# Patient Record
Sex: Female | Born: 1995 | Race: White | Hispanic: No | Marital: Married | Smoking: Former smoker
Health system: Southern US, Community
[De-identification: ages and names within clinical notes are randomized; demographics above are authoritative.]

## PROBLEM LIST (undated history)

## (undated) ENCOUNTER — Inpatient Hospital Stay (HOSPITAL_COMMUNITY): Payer: Self-pay

## (undated) DIAGNOSIS — R519 Headache, unspecified: Secondary | ICD-10-CM

## (undated) DIAGNOSIS — F909 Attention-deficit hyperactivity disorder, unspecified type: Secondary | ICD-10-CM

## (undated) DIAGNOSIS — J45909 Unspecified asthma, uncomplicated: Secondary | ICD-10-CM

## (undated) DIAGNOSIS — R51 Headache: Secondary | ICD-10-CM

## (undated) HISTORY — PX: TONSILLECTOMY: SUR1361

## (undated) HISTORY — PX: ADENOIDECTOMY: SUR15

---
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
## 2014-03-10 ENCOUNTER — Encounter (HOSPITAL_COMMUNITY): Payer: Self-pay | Admitting: Anesthesiology

## 2014-11-19 ENCOUNTER — Inpatient Hospital Stay (HOSPITAL_COMMUNITY)
Admission: AD | Admit: 2014-11-19 | Discharge: 2014-11-19 | Disposition: A | Discharge status: Home / Self Care | Payer: Medicaid Other | Source: Ambulatory Visit | Attending: Obstetrics and Gynecology | Admitting: Obstetrics and Gynecology

## 2014-11-19 DIAGNOSIS — Z3200 Encounter for pregnancy test, result unknown: Secondary | ICD-10-CM | POA: Diagnosis present

## 2014-11-19 DIAGNOSIS — Z3202 Encounter for pregnancy test, result negative: Secondary | ICD-10-CM | POA: Diagnosis not present

## 2014-11-19 LAB — URINALYSIS, ROUTINE W REFLEX MICROSCOPIC
Bilirubin Urine: NEGATIVE
Glucose, UA: NEGATIVE mg/dL
Ketones, ur: NEGATIVE mg/dL
Leukocytes, UA: NEGATIVE
NITRITE: NEGATIVE
PH: 7 (ref 5.0–8.0)
PROTEIN: NEGATIVE mg/dL
Specific Gravity, Urine: 1.015 (ref 1.005–1.030)
Urobilinogen, UA: 0.2 mg/dL (ref 0.0–1.0)

## 2014-11-19 LAB — URINE MICROSCOPIC-ADD ON

## 2014-11-19 LAB — POCT PREGNANCY, URINE: Preg Test, Ur: NEGATIVE

## 2014-11-19 NOTE — MAU Note (Signed)
Patient presents for possible pregnancy and states she has Nexplanon implant. Denies bleeding but states she has noticed a discharge.

## 2014-11-19 NOTE — MAU Provider Note (Signed)
Danielle ArdsJenny N Morris 19 y.o.. G1P1001 presents to MAU for pregnancy test only.  She denies complaint but is worried.  She has Nexplanon in place.  It was placed 1.5 years ago.  She has a friend that had Nexplanon, had negative pregnancy tests and then found out late of pregnancy.  She has NOT had any positive home pregnancy tests.    ROS: Pertinent ROS in HPI.  All other systems are negative.    Objective:   BP 137/73 mmHg  Pulse 93  Temp(Src) 98.2 F (36.8 C) (Oral)  Resp 16  Ht 5' 1.75" (1.568 m)  Wt 129 lb 6 oz (58.684 kg)  BMI 23.87 kg/m2  LMP 10/12/2014  General appearance: well developed, well nourished.   Mental status: alert and oriented.    Results for orders placed or performed during the hospital encounter of 11/19/14 (from the past 48 hour(s))  Pregnancy, urine POC     Status: None   Collection Time: 11/19/14  4:55 PM  Result Value Ref Range   Preg Test, Ur NEGATIVE NEGATIVE    Comment:        THE SENSITIVITY OF THIS METHODOLOGY IS >24 mIU/mL     MDM: Due to no complaint, there is no reason for further workup.    A: Negative Pregnancy Test  P: Discharge to home. Pt advised to see her gynecologist, Dr. Ambrose MantleHenley, for further discussion and workup as needed.   Patient may return to MAU as needed or if her condition were to change or worsen

## 2015-04-07 ENCOUNTER — Emergency Department (HOSPITAL_COMMUNITY)
Admission: EM | Admit: 2015-04-07 | Discharge: 2015-04-07 | Disposition: A | Discharge status: Home / Self Care | Payer: Medicaid Other | Attending: Emergency Medicine | Admitting: Emergency Medicine

## 2015-04-07 ENCOUNTER — Emergency Department (HOSPITAL_COMMUNITY): Payer: Medicaid Other

## 2015-04-07 ENCOUNTER — Encounter (HOSPITAL_COMMUNITY): Payer: Self-pay | Admitting: Emergency Medicine

## 2015-04-07 DIAGNOSIS — Z79899 Other long term (current) drug therapy: Secondary | ICD-10-CM | POA: Insufficient documentation

## 2015-04-07 DIAGNOSIS — Z8659 Personal history of other mental and behavioral disorders: Secondary | ICD-10-CM | POA: Diagnosis not present

## 2015-04-07 DIAGNOSIS — J45901 Unspecified asthma with (acute) exacerbation: Secondary | ICD-10-CM | POA: Diagnosis not present

## 2015-04-07 DIAGNOSIS — J40 Bronchitis, not specified as acute or chronic: Secondary | ICD-10-CM

## 2015-04-07 DIAGNOSIS — R05 Cough: Secondary | ICD-10-CM | POA: Diagnosis present

## 2015-04-07 MED ORDER — PREDNISONE 20 MG PO TABS: 60.0000 mg | ORAL_TABLET | Freq: Every day | ORAL | Status: DC

## 2015-04-07 MED ORDER — ALBUTEROL SULFATE HFA 108 (90 BASE) MCG/ACT IN AERS
2.0000 | INHALATION_SPRAY | RESPIRATORY_TRACT | Status: DC | PRN
  Administered 2015-04-07: 2 via RESPIRATORY_TRACT
  Filled 2015-04-07: qty 6.7

## 2015-04-07 MED ORDER — ALBUTEROL SULFATE HFA 108 (90 BASE) MCG/ACT IN AERS: 2.0000 | INHALATION_SPRAY | RESPIRATORY_TRACT | Status: DC | PRN

## 2015-04-07 NOTE — ED Notes (Signed)
Pt. Left with all belongings and refused wheelchair. Discharge instructions were reviewed and all questions were answered.  

## 2015-04-07 NOTE — Discharge Instructions (Signed)

## 2015-04-07 NOTE — ED Provider Notes (Signed)
CSN: [id number]     Arrival date & time 04/07/15  [id number] History   First MD Initiated Contact with Patient 04/07/15 0449     Chief Complaint  Patient presents with  . Cough     (Consider location/radiation/quality/duration/timing/severity/associated sxs/prior Treatment) HPI  This is a [age] year old female with history of asthma who presents with cough. Patient reports cough, chest congestion, nasal congestion 2 weeks. Acutely worsening of the last several days. History of asthma but is not using an inhaler and reports that it is "not bad." Patient is also a smoker. Cough is nonproductive. Denies fevers at home. Patient has been taking over-the-counter medications without relief.  Past Medical History  Diagnosis Date  . Asthma   . ADHD (attention deficit hyperactivity disorder)    Past Surgical History  Procedure Laterality Date  . Tonsillectomy    . Adenoidectomy     No family history on file. Social History  Substance Use Topics  . Smoking status: Never Smoker   . Smokeless tobacco: Never Used  . Alcohol Use: No   OB History    Gravida Para Term Preterm AB TAB SAB Ectopic Multiple Living   1 1 1       1      Review of Systems  Constitutional: Negative for fever.  HENT: Positive for congestion.   Respiratory: Positive for cough and shortness of breath. Negative for chest tightness.   Cardiovascular: Negative for chest pain and leg swelling.  Gastrointestinal: Negative for abdominal pain.  All other systems reviewed and are negative.     Allergies  Review of patient's allergies indicates no known allergies.  Home Medications   Prior to Admission medications   Medication Sig Start Date End Date Taking? Authorizing Provider  albuterol (PROVENTIL HFA;VENTOLIN HFA) 108 (90 BASE) MCG/ACT inhaler Inhale 2 puffs into the lungs every 4 (four) hours as needed for wheezing or shortness of breath. 04/07/15   Shon Batonourtney F Horton, MD  ibuprofen (ADVIL,MOTRIN) 100 MG/5ML suspension  Take 30 mLs (600 mg total) by mouth every 6 (six) hours as needed. 07/13/13   Tracey Harrieshomas Henley, MD  predniSONE (DELTASONE) 20 MG tablet Take 3 tablets (60 mg total) by mouth daily with breakfast. 04/07/15   Shon Batonourtney F Horton, MD  Prenatal Vit-Fe Fumarate-FA (PRENATAL MULTIVITAMIN) TABS tablet Take 1 tablet by mouth daily at 12 noon.    Historical Provider, MD   BP 107/66 mmHg  Pulse 88  Temp(Src) 97.9 F (36.6 C) (Oral)  Resp 16  Ht 5\' 2"  (1.575 m)  Wt 128 lb (58.06 kg)  BMI 23.41 kg/m2  SpO2 97%  LMP 03/17/2015 (Approximate) Physical Exam  Constitutional: She is oriented to person, place, and time. She appears well-developed and well-nourished. No distress.  HENT:  Head: Normocephalic and atraumatic.  Neck: Neck supple.  Cardiovascular: Normal rate, regular rhythm and normal heart sounds.   No murmur heard. Pulmonary/Chest: Effort normal and breath sounds normal. No respiratory distress. She has no wheezes.  Scant expiratory wheeze  Abdominal: Soft. There is no tenderness.  Musculoskeletal: She exhibits no edema.  Neurological: She is alert and oriented to person, place, and time.  Skin: Skin is warm and dry.  Psychiatric: She has a normal mood and affect.  Nursing note and vitals reviewed.   ED Course  Procedures (including critical care time) Labs Review Labs Reviewed - No data to display  Imaging Review Dg Chest 2 View  04/07/2015  CLINICAL DATA:  Cough for 3 weeks.  Dyspnea tonight. EXAM: CHEST  2 VIEW COMPARISON:  06/01/2010 FINDINGS: The heart size and mediastinal contours are within normal limits. Both lungs are clear. The visualized skeletal structures are unremarkable. IMPRESSION: No active cardiopulmonary disease. Electronically Signed   By: Ellery Plunk M.D.   On: 04/07/2015 05:18   I have personally reviewed and evaluated these images and lab results as part of my medical decision-making.   EKG Interpretation None      MDM   Final diagnoses:  Bronchitis     Patient presents with cough and congestion 2 weeks. Nontoxic on exam. Afebrile. Vital signs are reassuring. Physical exam is largely unremarkable. No significant wheezing. However, given patient's history of asthma and smoking, will order an inhaler to see if that helps with the cough. Chest x-ray is negative for pneumonia. Suspect viral URI/bronchitis.  Discuss with patient supportive care home. Will place on 5 days of steroids for airway inflammation.  After history, exam, and medical workup I feel the patient has been appropriately medically screened and is safe for discharge home. Pertinent diagnoses were discussed with the patient. Patient was given return precautions.     Shon Baton, MD 04/07/15 [phone]

## 2015-04-07 NOTE — ED Notes (Signed)
Pt. reports persistent productive cough with chest congestion / SOB and nasal congestion onset 2 weeks ago unrelieved by OTC cough medications .

## 2015-05-10 NOTE — L&D Delivery Note (Signed)
Delivery Note At 4:49 PM a viable and healthy female was delivered via Vaginal, Spontaneous Delivery (Presentation: OA; ROT).  APGAR: 8, 9; weight P  .   Placenta status: delivered, intact .  Cord: 3V with the following complications: none.     Pt with mild shoulder dystocia, relieved with McRoberts and posterior shoulder dislodgement.  apx 20 sec.  Anesthesia:  epidural Episiotomy: None Lacerations: Labial R Suture Repair: 3.0 vicryl rapide Est. Blood Loss (mL): 250cc  Mom to postpartum.  Baby to Couplet care / Skin to Skin.  Bovard-Stuckert, Charley Lafrance 03/17/2016, 5:33 PM  Br/O+/RI/Tdap in PNC/Contra IUD  D/w pt circumcision for female infant, inc r/b/a - will have circ in office

## 2015-11-13 ENCOUNTER — Encounter (HOSPITAL_COMMUNITY): Payer: Self-pay | Admitting: *Deleted

## 2015-11-13 ENCOUNTER — Inpatient Hospital Stay (HOSPITAL_COMMUNITY)
Admission: AD | Admit: 2015-11-13 | Discharge: 2015-11-13 | Disposition: A | Discharge status: Home / Self Care | Payer: Medicaid Other | Source: Ambulatory Visit | Attending: Obstetrics and Gynecology | Admitting: Obstetrics and Gynecology

## 2015-11-13 DIAGNOSIS — Z3A20 20 weeks gestation of pregnancy: Secondary | ICD-10-CM | POA: Diagnosis not present

## 2015-11-13 DIAGNOSIS — J45909 Unspecified asthma, uncomplicated: Secondary | ICD-10-CM | POA: Insufficient documentation

## 2015-11-13 DIAGNOSIS — F909 Attention-deficit hyperactivity disorder, unspecified type: Secondary | ICD-10-CM | POA: Insufficient documentation

## 2015-11-13 DIAGNOSIS — O99342 Other mental disorders complicating pregnancy, second trimester: Secondary | ICD-10-CM | POA: Insufficient documentation

## 2015-11-13 DIAGNOSIS — O4702 False labor before 37 completed weeks of gestation, second trimester: Secondary | ICD-10-CM | POA: Diagnosis not present

## 2015-11-13 DIAGNOSIS — O99512 Diseases of the respiratory system complicating pregnancy, second trimester: Secondary | ICD-10-CM | POA: Insufficient documentation

## 2015-11-13 LAB — URINALYSIS, ROUTINE W REFLEX MICROSCOPIC
Bilirubin Urine: NEGATIVE
Glucose, UA: NEGATIVE mg/dL
Hgb urine dipstick: NEGATIVE
Ketones, ur: NEGATIVE mg/dL
LEUKOCYTES UA: NEGATIVE
Nitrite: NEGATIVE
PROTEIN: NEGATIVE mg/dL
SPECIFIC GRAVITY, URINE: 1.01 (ref 1.005–1.030)
pH: 7 (ref 5.0–8.0)

## 2015-11-13 NOTE — Progress Notes (Signed)
Toco only applied since 4515w6d

## 2015-11-13 NOTE — MAU Provider Note (Signed)
History     CSN: [id number]  Arrival date and time: 11/13/15 2015   First Provider Initiated Contact with Patient 11/13/15 2050      Chief Complaint  Patient presents with  . abdominal tightening    HPI Danielle Goodman is a 20 y.o. G2P1001 at 2510w6d who presents with abdominal tightening. Symptoms began at 3 pm this afternoon & occurs every 5-10 minutes. Reports abdominal tightening. Denies abdominal pain, vaginal bleeding or LOF. Last intercourse 3 days ago. Pt states she thinks she overworked herself today by running errands, cleaning the house, & not drinking enough water.   OB History    Gravida Para Term Preterm AB TAB SAB Ectopic Multiple Living   2 1 1       1       Past Medical History  Diagnosis Date  . Asthma   . ADHD (attention deficit hyperactivity disorder)     Past Surgical History  Procedure Laterality Date  . Tonsillectomy    . Adenoidectomy      History reviewed. No pertinent family history.  Social History  Substance Use Topics  . Smoking status: Never Smoker   . Smokeless tobacco: Never Used  . Alcohol Use: No    Allergies: No Known Allergies  Prescriptions prior to admission  Medication Sig Dispense Refill Last Dose  . acetaminophen (TYLENOL) 325 MG tablet Take 650 mg by mouth every 6 (six) hours as needed for headache.    Past Week at Unknown time  . calcium carbonate (TUMS - DOSED IN MG ELEMENTAL CALCIUM) 500 MG chewable tablet Chew 2 tablets by mouth daily.   11/12/2015 at Unknown time  . Prenatal Vit-Fe Fumarate-FA (PRENATAL MULTIVITAMIN) TABS tablet Take 1 tablet by mouth daily at 12 noon.   11/13/2015 at Unknown time  . albuterol (PROVENTIL HFA;VENTOLIN HFA) 108 (90 BASE) MCG/ACT inhaler Inhale 2 puffs into the lungs every 4 (four) hours as needed for wheezing or shortness of breath. (Patient not taking: Reported on 11/13/2015) 1 Inhaler 0   . ibuprofen (ADVIL,MOTRIN) 100 MG/5ML suspension Take 30 mLs (600 mg total) by mouth every 6 (six) hours as  needed. (Patient not taking: Reported on 11/13/2015) 473 mL 2   . predniSONE (DELTASONE) 20 MG tablet Take 3 tablets (60 mg total) by mouth daily with breakfast. (Patient not taking: Reported on 11/13/2015) 15 tablet 0     Review of Systems  Constitutional: Negative.   Gastrointestinal: Negative.   Genitourinary: Negative.    Physical Exam   Blood pressure 132/70, pulse 80, temperature 98.2 F (36.8 C), resp. rate 18, height 5\' 1"  (1.549 m), weight 144 lb 9.6 oz (65.59 kg), last menstrual period 03/17/2015, unknown if currently breastfeeding.  Physical Exam  Nursing note and vitals reviewed. Constitutional: She is oriented to person, place, and time. She appears well-developed and well-nourished. No distress.  HENT:  Head: Normocephalic and atraumatic.  Eyes: Conjunctivae are normal. Right eye exhibits no discharge. Left eye exhibits no discharge. No scleral icterus.  Neck: Normal range of motion.  Cardiovascular: Normal rate.   Respiratory: Effort normal. No respiratory distress.  GI: Soft. There is no tenderness.  Neurological: She is alert and oriented to person, place, and time.  Skin: Skin is warm and dry. She is not diaphoretic.  Psychiatric: She has a normal mood and affect. Her behavior is normal. Judgment and thought content normal.   Dilation: Closed Effacement (%): Thick Cervical Position: Posterior Station: -3 Exam by:: Judeth HornErin Armonte Tortorella NP  MAU Course  Procedures Results for orders placed or performed during the hospital encounter of 11/13/15 (from the past 24 hour(s))  Urinalysis, Routine w reflex microscopic ([facility])     Status: None   Collection Time: 11/13/15  8:40 PM  Result Value Ref Range   Color, Urine YELLOW YELLOW   APPearance CLEAR CLEAR   Specific Gravity, Urine 1.010 1.005 - 1.030   pH 7.0 5.0 - 8.0   Glucose, UA NEGATIVE NEGATIVE mg/dL   Hgb urine dipstick NEGATIVE NEGATIVE   Bilirubin Urine NEGATIVE NEGATIVE   Ketones, ur NEGATIVE NEGATIVE mg/dL    Protein, ur NEGATIVE NEGATIVE mg/dL   Nitrite NEGATIVE NEGATIVE   Leukocytes, UA NEGATIVE NEGATIVE    MDM FHT 145 by doppler No ctx on toco Abdomen palpates soft PO hydrate Cervix closed S.w Dr. Senaida Oresichardson. Encourage pt to stay hydrated. Ok to discharge home.  Assessment and Plan  A: 1. Preterm contractions, second trimester     P; Discharge home Preterm labor precautions Increased water intake during the day Keep f/u with OB  Judeth HornErin Blade Scheff 11/13/2015, 8:50 PM

## 2015-11-13 NOTE — Discharge Instructions (Signed)

## 2015-11-13 NOTE — MAU Note (Signed)
Having tightening in stomach since 1500. No pain. Denies vag bleeding or LOF. With first baby had ctxs at 25-26wks but del at 38wks

## 2015-12-03 ENCOUNTER — Inpatient Hospital Stay (HOSPITAL_COMMUNITY)
Admission: AD | Admit: 2015-12-03 | Discharge: 2015-12-03 | Disposition: A | Discharge status: Home / Self Care | Payer: Medicaid Other | Source: Ambulatory Visit | Attending: Obstetrics and Gynecology | Admitting: Obstetrics and Gynecology

## 2015-12-03 ENCOUNTER — Encounter (HOSPITAL_COMMUNITY): Payer: Self-pay | Admitting: Obstetrics and Gynecology

## 2015-12-03 DIAGNOSIS — Z87891 Personal history of nicotine dependence: Secondary | ICD-10-CM | POA: Insufficient documentation

## 2015-12-03 DIAGNOSIS — F909 Attention-deficit hyperactivity disorder, unspecified type: Secondary | ICD-10-CM | POA: Diagnosis not present

## 2015-12-03 DIAGNOSIS — G43909 Migraine, unspecified, not intractable, without status migrainosus: Secondary | ICD-10-CM | POA: Diagnosis present

## 2015-12-03 DIAGNOSIS — O99512 Diseases of the respiratory system complicating pregnancy, second trimester: Secondary | ICD-10-CM | POA: Diagnosis not present

## 2015-12-03 DIAGNOSIS — O26892 Other specified pregnancy related conditions, second trimester: Secondary | ICD-10-CM | POA: Diagnosis not present

## 2015-12-03 DIAGNOSIS — Z3A23 23 weeks gestation of pregnancy: Secondary | ICD-10-CM | POA: Diagnosis not present

## 2015-12-03 DIAGNOSIS — J45909 Unspecified asthma, uncomplicated: Secondary | ICD-10-CM | POA: Diagnosis not present

## 2015-12-03 DIAGNOSIS — Z79899 Other long term (current) drug therapy: Secondary | ICD-10-CM | POA: Insufficient documentation

## 2015-12-03 DIAGNOSIS — O99342 Other mental disorders complicating pregnancy, second trimester: Secondary | ICD-10-CM | POA: Insufficient documentation

## 2015-12-03 DIAGNOSIS — O99352 Diseases of the nervous system complicating pregnancy, second trimester: Secondary | ICD-10-CM | POA: Diagnosis not present

## 2015-12-03 DIAGNOSIS — R51 Headache: Secondary | ICD-10-CM

## 2015-12-03 LAB — URINE MICROSCOPIC-ADD ON

## 2015-12-03 LAB — URINALYSIS, ROUTINE W REFLEX MICROSCOPIC
Bilirubin Urine: NEGATIVE
Glucose, UA: NEGATIVE mg/dL
Ketones, ur: NEGATIVE mg/dL
LEUKOCYTES UA: NEGATIVE
Nitrite: NEGATIVE
PROTEIN: NEGATIVE mg/dL
pH: 6.5 (ref 5.0–8.0)

## 2015-12-03 MED ORDER — BUTALBITAL-APAP-CAFFEINE 50-325-40 MG PO TABS: 1.0000 | ORAL_TABLET | ORAL | 0 refills | Status: DC | PRN

## 2015-12-03 NOTE — MAU Note (Signed)
Urine in lab 

## 2015-12-03 NOTE — Discharge Instructions (Signed)

## 2015-12-03 NOTE — MAU Provider Note (Signed)
History     CSN: [id number]  Arrival date and time: 12/03/15 1608   First Provider Initiated Contact with Patient 12/03/15 1832      Chief Complaint  Patient presents with  . Headache  . visual changes   Danielle Goodman is a 20 y.o. G2P1001 at [redacted]w[redacted]d who presents with headache & vision changes. Denies OB complaints. Positive fetal movement. Pt reports these symptoms occur weekly since [redacted] week gestation and occurred with her previous pregnancy as well.   Headache   This is a recurrent problem. The current episode started today. Episode frequency: weekly during pregnancy. The problem has been unchanged. The pain is located in the frontal region. The pain does not radiate. The pain quality is similar to prior headaches. The quality of the pain is described as dull and aching. The pain is at a severity of 8/10 (currently no pain; headache resolved PTA without meds). Associated symptoms include a visual change. Pertinent negatives include no abdominal pain, abnormal behavior, blurred vision, coughing, dizziness, fever, loss of balance, nausea, neck pain, phonophobia, photophobia or vomiting. Nothing aggravates the symptoms. She has tried acetaminophen for the symptoms. The treatment provided no relief. Her past medical history is significant for migraine headaches (during pregnancy). There is no history of hypertension, pseudotumor cerebri or recent head traumas.     OB History    Gravida Para Term Preterm AB Living   2 1 1     1    SAB TAB Ectopic Multiple Live Births                  Past Medical History:  Diagnosis Date  . ADHD (attention deficit hyperactivity disorder)   . Asthma     Past Surgical History:  Procedure Laterality Date  . ADENOIDECTOMY    . TONSILLECTOMY      History reviewed. No pertinent family history.  Social History  Substance Use Topics  . Smoking status: Former Games developer  . Smokeless tobacco: Never Used  . Alcohol use No    Allergies: No Known  Allergies  Prescriptions Prior to Admission  Medication Sig Dispense Refill Last Dose  . acetaminophen (TYLENOL) 325 MG tablet Take 650 mg by mouth every 6 (six) hours as needed for headache.    Past Month at Unknown time  . calcium carbonate (TUMS - DOSED IN MG ELEMENTAL CALCIUM) 500 MG chewable tablet Chew 2 tablets by mouth daily.   Past Month at Unknown time  . Prenatal Vit-Fe Fumarate-FA (PRENATAL MULTIVITAMIN) TABS tablet Take 1 tablet by mouth daily at 12 noon.   12/03/2015 at Unknown time  . albuterol (PROVENTIL HFA;VENTOLIN HFA) 108 (90 BASE) MCG/ACT inhaler Inhale 2 puffs into the lungs every 4 (four) hours as needed for wheezing or shortness of breath. (Patient not taking: Reported on 11/13/2015) 1 Inhaler 0     Review of Systems  Constitutional: Negative for fever.  Eyes: Negative for blurred vision and photophobia.       + photopsia No scotoma  Respiratory: Negative for cough.   Gastrointestinal: Negative for abdominal pain, nausea and vomiting.  Genitourinary: Negative.   Musculoskeletal: Negative for neck pain.  Neurological: Positive for headaches. Negative for dizziness and loss of balance.   Physical Exam   Blood pressure 114/61, pulse 75, temperature 98.1 F (36.7 C), temperature source Oral, resp. rate 16, last menstrual period 03/17/2015, unknown if currently breastfeeding.   Temp:  [98.1 F (36.7 C)-98.4 F (36.9 C)] 98.4 F (36.9 C) (07/27 1916) Pulse  Rate:  [75-91] 91 (07/27 1916) Resp:  [16-18] 18 (07/27 1916) BP: (114-120)/(59-61) 120/59 (07/27 1916)   Physical Exam  Nursing note and vitals reviewed. Constitutional: She is oriented to person, place, and time. She appears well-developed and well-nourished. No distress.  HENT:  Head: Normocephalic and atraumatic.  Eyes: Conjunctivae are normal. Pupils are equal, round, and reactive to light. Right eye exhibits no discharge. Left eye exhibits no discharge. No scleral icterus.  Neck: Normal range of motion.   Cardiovascular: Normal rate, regular rhythm and normal heart sounds.   No murmur heard. Respiratory: Effort normal and breath sounds normal. No respiratory distress. She has no wheezes.  GI: Soft. There is no tenderness.  Musculoskeletal: She exhibits no edema.  Neurological: She is alert and oriented to person, place, and time. She has normal reflexes.  Skin: Skin is warm and dry. She is not diaphoretic.  Psychiatric: She has a normal mood and affect. Her behavior is normal. Judgment and thought content normal.   Fetal Tracing:  Baseline: 145 Variability: moderate Accelerations: none Decelerations: none  Toco: none MAU Course  Procedures Results for orders placed or performed during the hospital encounter of 12/03/15 (from the past 24 hour(s))  Urinalysis, Routine w reflex microscopic (not at [facility])     Status: Abnormal   Collection Time: 12/03/15  5:00 PM  Result Value Ref Range   Color, Urine YELLOW YELLOW   APPearance CLEAR CLEAR   Specific Gravity, Urine <1.005 (L) 1.005 - 1.030   pH 6.5 5.0 - 8.0   Glucose, UA NEGATIVE NEGATIVE mg/dL   Hgb urine dipstick TRACE (A) NEGATIVE   Bilirubin Urine NEGATIVE NEGATIVE   Ketones, ur NEGATIVE NEGATIVE mg/dL   Protein, ur NEGATIVE NEGATIVE mg/dL   Nitrite NEGATIVE NEGATIVE   Leukocytes, UA NEGATIVE NEGATIVE  Urine microscopic-add on     Status: Abnormal   Collection Time: 12/03/15  5:00 PM  Result Value Ref Range   Squamous Epithelial / LPF 0-5 (A) NONE SEEN   WBC, UA 0-5 0 - 5 WBC/hpf   RBC / HPF 0-5 0 - 5 RBC/hpf   Bacteria, UA FEW (A) NONE SEEN    MDM Category 1 fetal tracing Pt asymptomatic at time of MAU visit Normotensive S/w Dr. Jackelyn Knife; discussed VS & assessment. Ok to discharge home with rx fioricet to have on hand  Assessment and Plan  A: 1. Headache in pregnancy, second trimester     P: Discharge home Rx fioricet Discussed reasons to return to MAU Keep f/u with OB  Judeth Horn 12/03/2015, 6:32 PM

## 2015-12-03 NOTE — MAU Note (Signed)
Pt sent to MAU by MD - pt woke up this morning with visual disturbances & a migraine HA.  The flashing lights went away but are now back.  HA has remained all day.  Denies contractions, bleeding or LOF.

## 2015-12-28 LAB — OB RESULTS CONSOLE HIV ANTIBODY (ROUTINE TESTING): HIV: NONREACTIVE

## 2015-12-28 LAB — OB RESULTS CONSOLE RPR: RPR: NONREACTIVE

## 2016-03-01 LAB — OB RESULTS CONSOLE GBS: GBS: NEGATIVE

## 2016-03-01 LAB — OB RESULTS CONSOLE GC/CHLAMYDIA
CHLAMYDIA, DNA PROBE: NEGATIVE
GC PROBE AMP, GENITAL: NEGATIVE

## 2016-03-13 ENCOUNTER — Inpatient Hospital Stay (HOSPITAL_COMMUNITY)
Admission: AD | Admit: 2016-03-13 | Discharge: 2016-03-14 | Disposition: A | Discharge status: Home / Self Care | Payer: Medicaid Other | Source: Ambulatory Visit | Attending: Obstetrics and Gynecology | Admitting: Obstetrics and Gynecology

## 2016-03-13 ENCOUNTER — Encounter (HOSPITAL_COMMUNITY): Payer: Self-pay | Admitting: *Deleted

## 2016-03-13 DIAGNOSIS — O9989 Other specified diseases and conditions complicating pregnancy, childbirth and the puerperium: Secondary | ICD-10-CM | POA: Diagnosis not present

## 2016-03-13 DIAGNOSIS — R1011 Right upper quadrant pain: Secondary | ICD-10-CM | POA: Diagnosis present

## 2016-03-13 DIAGNOSIS — R51 Headache: Secondary | ICD-10-CM

## 2016-03-13 DIAGNOSIS — R109 Unspecified abdominal pain: Secondary | ICD-10-CM

## 2016-03-13 DIAGNOSIS — Z3A38 38 weeks gestation of pregnancy: Secondary | ICD-10-CM | POA: Insufficient documentation

## 2016-03-13 DIAGNOSIS — O479 False labor, unspecified: Secondary | ICD-10-CM

## 2016-03-13 DIAGNOSIS — O26893 Other specified pregnancy related conditions, third trimester: Secondary | ICD-10-CM

## 2016-03-13 DIAGNOSIS — O471 False labor at or after 37 completed weeks of gestation: Secondary | ICD-10-CM | POA: Diagnosis not present

## 2016-03-13 DIAGNOSIS — Z87891 Personal history of nicotine dependence: Secondary | ICD-10-CM | POA: Insufficient documentation

## 2016-03-13 DIAGNOSIS — G43109 Migraine with aura, not intractable, without status migrainosus: Secondary | ICD-10-CM | POA: Diagnosis not present

## 2016-03-13 HISTORY — DX: Headache: R51

## 2016-03-13 HISTORY — DX: Headache, unspecified: R51.9

## 2016-03-13 NOTE — MAU Note (Signed)
Pt states she spoke to her MD this afternoon about pain in her right upper abd/ribs and after speaking with the doctor she began visual changes ("flashing lights"). States she is having some tightening  In her abd too.

## 2016-03-13 NOTE — MAU Note (Signed)
Sent urine to the lab

## 2016-03-14 DIAGNOSIS — O471 False labor at or after 37 completed weeks of gestation: Secondary | ICD-10-CM

## 2016-03-14 DIAGNOSIS — G43109 Migraine with aura, not intractable, without status migrainosus: Secondary | ICD-10-CM | POA: Diagnosis not present

## 2016-03-14 DIAGNOSIS — O9989 Other specified diseases and conditions complicating pregnancy, childbirth and the puerperium: Secondary | ICD-10-CM | POA: Diagnosis not present

## 2016-03-14 DIAGNOSIS — Z3A38 38 weeks gestation of pregnancy: Secondary | ICD-10-CM

## 2016-03-14 LAB — URINALYSIS, ROUTINE W REFLEX MICROSCOPIC
BILIRUBIN URINE: NEGATIVE
GLUCOSE, UA: NEGATIVE mg/dL
HGB URINE DIPSTICK: NEGATIVE
KETONES UR: NEGATIVE mg/dL
Leukocytes, UA: NEGATIVE
NITRITE: NEGATIVE
PH: 6.5 (ref 5.0–8.0)
Protein, ur: NEGATIVE mg/dL
SPECIFIC GRAVITY, URINE: 1.01 (ref 1.005–1.030)

## 2016-03-14 LAB — CBC
HEMATOCRIT: 32.6 % — AB (ref 36.0–46.0)
Hemoglobin: 11 g/dL — ABNORMAL LOW (ref 12.0–15.0)
MCH: 29.3 pg (ref 26.0–34.0)
MCHC: 33.7 g/dL (ref 30.0–36.0)
MCV: 86.9 fL (ref 78.0–100.0)
PLATELETS: 225 10*3/uL (ref 150–400)
RBC: 3.75 MIL/uL — AB (ref 3.87–5.11)
RDW: 15.5 % (ref 11.5–15.5)
WBC: 10.3 10*3/uL (ref 4.0–10.5)

## 2016-03-14 LAB — COMPREHENSIVE METABOLIC PANEL
ALT: 15 U/L (ref 14–54)
AST: 23 U/L (ref 15–41)
Albumin: 2.9 g/dL — ABNORMAL LOW (ref 3.5–5.0)
Alkaline Phosphatase: 150 U/L — ABNORMAL HIGH (ref 38–126)
Anion gap: 8 (ref 5–15)
BILIRUBIN TOTAL: 0.5 mg/dL (ref 0.3–1.2)
BUN: 9 mg/dL (ref 6–20)
CALCIUM: 9.5 mg/dL (ref 8.9–10.3)
CHLORIDE: 105 mmol/L (ref 101–111)
CO2: 21 mmol/L — ABNORMAL LOW (ref 22–32)
CREATININE: 0.53 mg/dL (ref 0.44–1.00)
Glucose, Bld: 117 mg/dL — ABNORMAL HIGH (ref 65–99)
Potassium: 4 mmol/L (ref 3.5–5.1)
Sodium: 134 mmol/L — ABNORMAL LOW (ref 135–145)
TOTAL PROTEIN: 6 g/dL — AB (ref 6.5–8.1)

## 2016-03-14 LAB — PROTEIN / CREATININE RATIO, URINE: CREATININE, URINE: 41 mg/dL

## 2016-03-14 MED ORDER — OXYCODONE-ACETAMINOPHEN 5-325 MG PO TABS
1.0000 | ORAL_TABLET | Freq: Once | ORAL | Status: AC
Start: 2016-03-14 — End: 2016-03-14
  Administered 2016-03-14: 1 via ORAL
  Filled 2016-03-14: qty 1

## 2016-03-14 MED ORDER — DIPHENHYDRAMINE HCL 25 MG PO CAPS
25.0000 mg | ORAL_CAPSULE | Freq: Once | ORAL | Status: AC
  Administered 2016-03-14: 25 mg via ORAL
  Filled 2016-03-14: qty 1

## 2016-03-14 MED ORDER — METOCLOPRAMIDE HCL 10 MG PO TABS
10.0000 mg | ORAL_TABLET | Freq: Once | ORAL | Status: AC
  Administered 2016-03-14: 10 mg via ORAL
  Filled 2016-03-14: qty 1

## 2016-03-14 NOTE — MAU Provider Note (Signed)
Chief Complaint:  No chief complaint on file.   None     HPI: Danielle Goodman is a 20 y.o. G2P1001 at 8163w2d who presents to maternity admissions reporting cramping/contractions with onset ~ 6 hours ago, RUQ abdominal/rib pain starting ~ 4 hours ago, and vision changes ~2-3 hours ago.  She reports the contractions are becoming closer together but are staying the same intensity over time. She tried resting, changing positions, drinking water, but nothing made them better or worse. The RUQ pain is dull, constant, under her ribs on the right side and is unchanged by movement, eating, or rest.  She has not tried any medications.  This pain is new, she has never had this pain before.  Her vision changes are described as seeing flashes of light and she reports these are the same as with her previous migraines. The flashes were associated with a mild h/a that resolved spontaneously. She has not tried any treatments for her vision changes or h/a. She reports good fetal movement, denies LOF, vaginal bleeding, vaginal itching/burning, urinary symptoms, h/a, dizziness, n/v, or fever/chills.    HPI  Past Medical History: Past Medical History:  Diagnosis Date  . ADHD (attention deficit hyperactivity disorder)   . Asthma   . Headache    Migrainse with pregnancty    Past obstetric history: OB History  Gravida Para Term Preterm AB Living  2 1 1     1   SAB TAB Ectopic Multiple Live Births          1    # Outcome Date GA Lbr Len/2nd Weight Sex Delivery Anes PTL Lv  2 Current           1 Term 07/11/13 8620w4d 11:10 / 00:55 7 lb (3.175 kg) M Vag-Spont EPI  LIV      Past Surgical History: Past Surgical History:  Procedure Laterality Date  . ADENOIDECTOMY    . TONSILLECTOMY      Family History: History reviewed. No pertinent family history.  Social History: Social History  Substance Use Topics  . Smoking status: Former Games developermoker  . Smokeless tobacco: Never Used  . Alcohol use No    Allergies: No  Known Allergies  Meds:  No prescriptions prior to admission.    ROS:  Review of Systems  Constitutional: Negative for chills, fatigue and fever.  Eyes: Positive for visual disturbance. Negative for photophobia.  Respiratory: Negative for shortness of breath.   Cardiovascular: Negative for chest pain.  Gastrointestinal: Positive for abdominal pain. Negative for nausea and vomiting.  Genitourinary: Negative for difficulty urinating, dysuria, flank pain, pelvic pain, vaginal bleeding, vaginal discharge and vaginal pain.  Neurological: Positive for headaches. Negative for dizziness.  Psychiatric/Behavioral: Negative.      I have reviewed patient's Past Medical Hx, Surgical Hx, Family Hx, Social Hx, medications and allergies.   Physical Exam   Patient Vitals for the past 24 hrs:  BP Temp Temp src Pulse Resp SpO2 Height Weight  03/14/16 0349 119/71 - - 83 18 - - -  03/14/16 0131 121/73 - - 98 - - - -  03/14/16 0120 126/74 - - 96 - - - -  03/14/16 0101 139/92 - - 87 - - - -  03/14/16 0046 143/84 - - 109 - - - -  03/14/16 0031 130/85 - - 92 - - - -  03/14/16 0023 138/88 - - 93 - - - -  03/13/16 2350 133/80 - - 92 - - - -  03/13/16 2340  139/77 98 F (36.7 C) Oral 81 16 99 % 5\' 1"  (1.549 m) 173 lb (78.5 kg)   Constitutional: Well-developed, well-nourished female in no acute distress.  Cardiovascular: normal rate Respiratory: normal effort GI: Abd soft, non-tender, gravid appropriate for gestational age.  MS: Extremities nontender, no edema, normal ROM Neurologic: Alert and oriented x 4.  GU: Neg CVAT.   Dilation: 3 Effacement (%): 50 Station: -3 Exam by:: L.Leftwich-Kirby,CNM  Vertex  FHT:  Baseline 135 , moderate variability, accelerations present, no decelerations Contractions: q 2-3 mins, moderate to palpation   Labs: Results for orders placed or performed during the hospital encounter of 03/13/16 (from the past 24 hour(s))  Protein / creatinine ratio, urine      Status: None   Collection Time: 03/13/16 11:42 PM  Result Value Ref Range   Creatinine, Urine 41.00 mg/dL   Total Protein, Urine <6.00 mg/dL   Protein Creatinine Ratio        0.00 - 0.15 mg/mg[Cre]  Urinalysis, Routine w reflex microscopic (not at [facility])     Status: None   Collection Time: 03/13/16 11:42 PM  Result Value Ref Range   Color, Urine YELLOW YELLOW   APPearance CLEAR CLEAR   Specific Gravity, Urine 1.010 1.005 - 1.030   pH 6.5 5.0 - 8.0   Glucose, UA NEGATIVE NEGATIVE mg/dL   Hgb urine dipstick NEGATIVE NEGATIVE   Bilirubin Urine NEGATIVE NEGATIVE   Ketones, ur NEGATIVE NEGATIVE mg/dL   Protein, ur NEGATIVE NEGATIVE mg/dL   Nitrite NEGATIVE NEGATIVE   Leukocytes, UA NEGATIVE NEGATIVE  CBC     Status: Abnormal   Collection Time: 03/14/16 12:41 AM  Result Value Ref Range   WBC 10.3 4.0 - 10.5 K/uL   RBC 3.75 (L) 3.87 - 5.11 MIL/uL   Hemoglobin 11.0 (L) 12.0 - 15.0 g/dL   HCT 16.132.6 (L) 09.636.0 - 04.546.0 %   MCV 86.9 78.0 - 100.0 fL   MCH 29.3 26.0 - 34.0 pg   MCHC 33.7 30.0 - 36.0 g/dL   RDW 40.915.5 81.111.5 - 91.415.5 %   Platelets 225 150 - 400 K/uL  Comprehensive metabolic panel     Status: Abnormal   Collection Time: 03/14/16 12:41 AM  Result Value Ref Range   Sodium 134 (L) 135 - 145 mmol/L   Potassium 4.0 3.5 - 5.1 mmol/L   Chloride 105 101 - 111 mmol/L   CO2 21 (L) 22 - 32 mmol/L   Glucose, Bld 117 (H) 65 - 99 mg/dL   BUN 9 6 - 20 mg/dL   Creatinine, Ser 7.820.53 0.44 - 1.00 mg/dL   Calcium 9.5 8.9 - 95.610.3 mg/dL   Total Protein 6.0 (L) 6.5 - 8.1 g/dL   Albumin 2.9 (L) 3.5 - 5.0 g/dL   AST 23 15 - 41 U/L   ALT 15 14 - 54 U/L   Alkaline Phosphatase 150 (H) 38 - 126 U/L   Total Bilirubin 0.5 0.3 - 1.2 mg/dL   GFR calc non Af Amer >60 >60 mL/min   GFR calc Af Amer >60 >60 mL/min   Anion gap 8 5 - 15      Imaging:  No results found.  MAU Course/MDM: I have ordered labs and reviewed results.  NST reviewed Preeclampsia labs ordered r/t pt visual symptoms, no elevated BP  noted today Consult Bovard-Stuckert with presentation, exam findings and test results.  No evidence of preeclampsia or GHTN today. Likely rib pain is musculoskeletal and headache is migraine c/w pt history.  Not in active labor with no cervical change in 1.5 hours Treat h/a with headache cocktail.  Pt does not want too many medications so only Benadryl 25 mg PO and Reglan 25 mg PO given. Pain reduced but still reports severe headache pain, similar to previous migraines.  Percocet 5/325 x 1 tablet given.  Preeclampsia precautions reviewed. Pt to f/u in office this week.  Pt stable at time of discharge.   Assessment: 1. Migraine with aura and without status migrainosus, not intractable   2. Headache in pregnancy, antepartum, third trimester   3. Threatened labor at term   4. Abdominal pain during pregnancy in third trimester     Plan: Discharge home Labor precautions and fetal kick counts Preeclampsia precautions  Follow-up Information    Bovard-Stuckert, Jody, MD. Schedule an appointment as soon as possible for a visit.   Specialty:  Obstetrics and Gynecology Why:  Follow up this week in the office, return to MAU as needed for signs of labor or emergencies Contact information: [address] [place] [address] [phone]            Medication List    TAKE these medications   acetaminophen 325 MG tablet Commonly known as:  TYLENOL Take 650 mg by mouth every 6 (six) hours as needed for headache.   albuterol 108 (90 Base) MCG/ACT inhaler Commonly known as:  PROVENTIL HFA;VENTOLIN HFA Inhale 2 puffs into the lungs every 4 (four) hours as needed for wheezing or shortness of breath.   butalbital-acetaminophen-caffeine 50-325-40 MG tablet Commonly known as:  FIORICET Take 1 tablet by mouth every 4 (four) hours as needed for headache.   calcium carbonate 500 MG chewable tablet Commonly known as:  TUMS - dosed in mg elemental calcium Chew 2 tablets by mouth  daily.   prenatal multivitamin Tabs tablet Take 1 tablet by mouth daily at 12 noon.       Sharen Counter Certified Nurse-Midwife 03/14/2016 6:11 AM

## 2016-03-14 NOTE — Discharge Instructions (Signed)
Hypertension During Pregnancy °Hypertension, or high blood pressure, is when there is extra pressure inside your blood vessels that carry blood from the heart to the rest of your body (arteries). It can happen at any time in life, including pregnancy. Hypertension during pregnancy can cause problems for you and your baby. Your baby might not weigh as much as he or she should at birth or might be born early (premature). Very bad cases of hypertension during pregnancy can be life-threatening.  °Different types of hypertension can occur during pregnancy. These include: °· Chronic hypertension. This happens when a woman has hypertension before pregnancy and it continues during pregnancy. °· Gestational hypertension. This is when hypertension develops during pregnancy. °· Preeclampsia or toxemia of pregnancy. This is a very serious type of hypertension that develops only during pregnancy. It affects the whole body and can be very dangerous for both mother and baby.   °Gestational hypertension and preeclampsia usually go away after your baby is born. Your blood pressure will likely stabilize within 6 weeks. Women who have hypertension during pregnancy have a greater chance of developing hypertension later in life or with future pregnancies. °RISK FACTORS °There are certain factors that make it more likely for you to develop hypertension during pregnancy. These include: °· Having hypertension before pregnancy. °· Having hypertension during a previous pregnancy. °· Being overweight. °· Being older than 40 years. °· Being pregnant with more than one baby. °· Having diabetes or kidney problems. °SIGNS AND SYMPTOMS °Chronic and gestational hypertension rarely cause symptoms. Preeclampsia has symptoms, which may include: °· Increased protein in your urine. Your health care provider will check for this at every prenatal visit. °· Swelling of your hands and face. °· Rapid weight gain. °· Headaches. °· Visual changes. °· Being  bothered by light. °· Abdominal pain, especially in the upper right area. °· Chest pain. °· Shortness of breath. °· Increased reflexes. °· Seizures. These occur with a more severe form of preeclampsia, called eclampsia. °DIAGNOSIS  °You may be diagnosed with hypertension during a regular prenatal exam. At each prenatal visit, you may have: °· Your blood pressure checked. °· A urine test to check for protein in your urine. °The type of hypertension you are diagnosed with depends on when you developed it. It also depends on your specific blood pressure reading. °· Developing hypertension before 20 weeks of pregnancy is consistent with chronic hypertension. °· Developing hypertension after 20 weeks of pregnancy is consistent with gestational hypertension. °· Hypertension with increased urinary protein is diagnosed as preeclampsia. °· Blood pressure measurements that stay above 160 systolic or 110 diastolic are a sign of severe preeclampsia. °TREATMENT °Treatment for hypertension during pregnancy varies. Treatment depends on the type of hypertension and how serious it is. °· If you take medicine for chronic hypertension, you may need to switch medicines. °¨ Medicines called ACE inhibitors should not be taken during pregnancy. °¨ Low-dose aspirin may be suggested for women who have risk factors for preeclampsia. °· If you have gestational hypertension, you may need to take a blood pressure medicine that is safe during pregnancy. Your health care provider will recommend the correct medicine. °· If you have severe preeclampsia, you may need to be in the hospital. Health care providers will watch you and your baby very closely. You also may need to take medicine called magnesium sulfate to prevent seizures and lower blood pressure. °· Sometimes, an early delivery is needed. This may be the case if the condition worsens. It would be   done to protect you and your baby. The only cure for preeclampsia is delivery.  Your health  care provider may recommend that you take one low-dose aspirin (81 mg) each day to help prevent high blood pressure during your pregnancy if you are at risk for preeclampsia. You may be at risk for preeclampsia if:  You had preeclampsia or eclampsia during a previous pregnancy.  Your baby did not grow as expected during a previous pregnancy.  You experienced preterm birth with a previous pregnancy.  You experienced a separation of the placenta from the uterus (placental abruption) during a previous pregnancy.  You experienced the loss of your baby during a previous pregnancy.  You are pregnant with more than one baby.  You have other medical conditions, such as diabetes or an autoimmune disease. HOME CARE INSTRUCTIONS  Schedule and keep all of your regular prenatal care appointments. This is important.  Take medicines only as directed by your health care provider. Tell your health care provider about all medicines you take.  Eat as little salt as possible.  Get regular exercise.  Do not drink alcohol.  Do not use tobacco products.  Do not drink products with caffeine.  Lie on your left side when resting. SEEK IMMEDIATE MEDICAL CARE IF:  You have severe abdominal pain.  You have sudden swelling in your hands, ankles, or face.  You gain 4 pounds (1.8 kg) or more in 1 week.  You vomit repeatedly.  You have vaginal bleeding.  You do not feel your baby moving as much.  You have a headache.  You have blurred or double vision.  You have muscle twitching or spasms.  You have shortness of breath.  You have blue fingernails or lips.  You have blood in your urine. MAKE SURE YOU:  Understand these instructions.  Will watch your condition.  Will get help right away if you are not doing well or get worse.   This information is not intended to replace advice given to you by your health care provider. Make sure you discuss any questions you have with your health care  provider.   Document Released: 01/11/2011 Document Revised: 05/16/2014 Document Reviewed: 11/22/2012 Elsevier Interactive Patient Education [employer]2016 [employer] Inc[employer]  Reasons to return to MAU:  1.  Contractions are  5 minutes apart or less, each last 1 minute, these have been going on for 1-2 hours, and you cannot walk or talk during them 2.  You have a large gush of fluid, or a trickle of fluid that will not stop and you have to wear a pad 3.  You have bleeding that is bright red, heavier than spotting--like menstrual bleeding (spotting can be normal in early labor or after a check of your cervix) 4.  You do not feel the baby moving like he/she normally does

## 2016-03-17 ENCOUNTER — Inpatient Hospital Stay (HOSPITAL_COMMUNITY): Payer: Medicaid Other | Admitting: Anesthesiology

## 2016-03-17 ENCOUNTER — Inpatient Hospital Stay (HOSPITAL_COMMUNITY)
Admission: AD | Admit: 2016-03-17 | Discharge: 2016-03-19 | DRG: 775 | Disposition: A | Discharge status: Home / Self Care | Payer: Medicaid Other | Source: Ambulatory Visit | Attending: Obstetrics and Gynecology | Admitting: Obstetrics and Gynecology

## 2016-03-17 ENCOUNTER — Encounter (HOSPITAL_COMMUNITY): Payer: Self-pay

## 2016-03-17 DIAGNOSIS — F909 Attention-deficit hyperactivity disorder, unspecified type: Secondary | ICD-10-CM | POA: Diagnosis present

## 2016-03-17 DIAGNOSIS — O99344 Other mental disorders complicating childbirth: Secondary | ICD-10-CM | POA: Diagnosis present

## 2016-03-17 DIAGNOSIS — Z3A38 38 weeks gestation of pregnancy: Secondary | ICD-10-CM

## 2016-03-17 DIAGNOSIS — Z87891 Personal history of nicotine dependence: Secondary | ICD-10-CM | POA: Diagnosis not present

## 2016-03-17 DIAGNOSIS — J45909 Unspecified asthma, uncomplicated: Secondary | ICD-10-CM | POA: Diagnosis present

## 2016-03-17 DIAGNOSIS — O9952 Diseases of the respiratory system complicating childbirth: Secondary | ICD-10-CM | POA: Diagnosis present

## 2016-03-17 DIAGNOSIS — Z3403 Encounter for supervision of normal first pregnancy, third trimester: Secondary | ICD-10-CM | POA: Diagnosis present

## 2016-03-17 LAB — CBC
HEMATOCRIT: 33.9 % — AB (ref 36.0–46.0)
HEMOGLOBIN: 11.4 g/dL — AB (ref 12.0–15.0)
MCH: 28.9 pg (ref 26.0–34.0)
MCHC: 33.6 g/dL (ref 30.0–36.0)
MCV: 86 fL (ref 78.0–100.0)
Platelets: 245 10*3/uL (ref 150–400)
RBC: 3.94 MIL/uL (ref 3.87–5.11)
RDW: 15.2 % (ref 11.5–15.5)
WBC: 10.7 10*3/uL — AB (ref 4.0–10.5)

## 2016-03-17 LAB — TYPE AND SCREEN
ABO/RH(D): O POS
ANTIBODY SCREEN: NEGATIVE

## 2016-03-17 MED ORDER — ONDANSETRON HCL 4 MG PO TABS: 4.0000 mg | ORAL_TABLET | ORAL | Status: DC | PRN

## 2016-03-17 MED ORDER — ONDANSETRON HCL 4 MG/2ML IJ SOLN: 4.0000 mg | Freq: Four times a day (QID) | INTRAMUSCULAR | Status: DC | PRN

## 2016-03-17 MED ORDER — IBUPROFEN 600 MG PO TABS
600.0000 mg | ORAL_TABLET | Freq: Four times a day (QID) | ORAL | Status: DC
  Administered 2016-03-17 – 2016-03-18 (×4): 600 mg via ORAL
  Filled 2016-03-17 (×4): qty 1

## 2016-03-17 MED ORDER — COCONUT OIL OIL
1.0000 "application " | TOPICAL_OIL | Status: DC | PRN
  Administered 2016-03-17: 1 via TOPICAL
  Filled 2016-03-17: qty 120

## 2016-03-17 MED ORDER — ONDANSETRON HCL 4 MG/2ML IJ SOLN: 4.0000 mg | INTRAMUSCULAR | Status: DC | PRN

## 2016-03-17 MED ORDER — LIDOCAINE HCL (PF) 1 % IJ SOLN
INTRAMUSCULAR | Status: DC | PRN
  Administered 2016-03-17: 6 mL via EPIDURAL
  Administered 2016-03-17: 4 mL via EPIDURAL

## 2016-03-17 MED ORDER — ACETAMINOPHEN 325 MG PO TABS: 650.0000 mg | ORAL_TABLET | ORAL | Status: DC | PRN

## 2016-03-17 MED ORDER — OXYCODONE HCL 5 MG PO TABS
5.0000 mg | ORAL_TABLET | ORAL | Status: DC | PRN
  Filled 2016-03-17: qty 1

## 2016-03-17 MED ORDER — ZOLPIDEM TARTRATE 5 MG PO TABS: 5.0000 mg | ORAL_TABLET | Freq: Every evening | ORAL | Status: DC | PRN

## 2016-03-17 MED ORDER — DIPHENHYDRAMINE HCL 50 MG/ML IJ SOLN: 12.5000 mg | INTRAMUSCULAR | Status: DC | PRN

## 2016-03-17 MED ORDER — EPHEDRINE 5 MG/ML INJ
10.0000 mg | INTRAVENOUS | Status: DC | PRN
  Filled 2016-03-17: qty 4

## 2016-03-17 MED ORDER — ACETAMINOPHEN 325 MG PO TABS
650.0000 mg | ORAL_TABLET | ORAL | Status: DC | PRN
  Administered 2016-03-17: 650 mg via ORAL
  Filled 2016-03-17: qty 2

## 2016-03-17 MED ORDER — PHENYLEPHRINE 40 MCG/ML (10ML) SYRINGE FOR IV PUSH (FOR BLOOD PRESSURE SUPPORT)
80.0000 ug | PREFILLED_SYRINGE | INTRAVENOUS | Status: DC | PRN
  Filled 2016-03-17: qty 5

## 2016-03-17 MED ORDER — OXYTOCIN BOLUS FROM INFUSION
500.0000 mL | Freq: Once | INTRAVENOUS | Status: AC
  Administered 2016-03-17: 500 mL via INTRAVENOUS

## 2016-03-17 MED ORDER — LACTATED RINGERS IV SOLN: 500.0000 mL | INTRAVENOUS | Status: DC | PRN

## 2016-03-17 MED ORDER — TETANUS-DIPHTH-ACELL PERTUSSIS 5-2.5-18.5 LF-MCG/0.5 IM SUSP: 0.5000 mL | Freq: Once | INTRAMUSCULAR | Status: DC

## 2016-03-17 MED ORDER — FENTANYL 2.5 MCG/ML BUPIVACAINE 1/10 % EPIDURAL INFUSION (WH - ANES)
14.0000 mL/h | INTRAMUSCULAR | Status: DC | PRN
  Administered 2016-03-17: 14 mL/h via EPIDURAL
  Filled 2016-03-17: qty 100

## 2016-03-17 MED ORDER — SOD CITRATE-CITRIC ACID 500-334 MG/5ML PO SOLN: 30.0000 mL | ORAL | Status: DC | PRN

## 2016-03-17 MED ORDER — PHENYLEPHRINE 40 MCG/ML (10ML) SYRINGE FOR IV PUSH (FOR BLOOD PRESSURE SUPPORT)
80.0000 ug | PREFILLED_SYRINGE | INTRAVENOUS | Status: DC | PRN
  Filled 2016-03-17: qty 5
  Filled 2016-03-17: qty 10

## 2016-03-17 MED ORDER — LACTATED RINGERS IV SOLN
INTRAVENOUS | Status: DC
  Administered 2016-03-17: 12:00:00 via INTRAVENOUS

## 2016-03-17 MED ORDER — OXYCODONE-ACETAMINOPHEN 5-325 MG PO TABS: 2.0000 | ORAL_TABLET | ORAL | Status: DC | PRN

## 2016-03-17 MED ORDER — LIDOCAINE HCL (PF) 1 % IJ SOLN
30.0000 mL | INTRAMUSCULAR | Status: DC | PRN
  Administered 2016-03-17: 30 mL via SUBCUTANEOUS
  Filled 2016-03-17: qty 30

## 2016-03-17 MED ORDER — OXYCODONE HCL 5 MG PO TABS: 10.0000 mg | ORAL_TABLET | ORAL | Status: DC | PRN

## 2016-03-17 MED ORDER — SENNOSIDES-DOCUSATE SODIUM 8.6-50 MG PO TABS
2.0000 | ORAL_TABLET | ORAL | Status: DC
  Administered 2016-03-17 – 2016-03-19 (×2): 2 via ORAL
  Filled 2016-03-17 (×2): qty 2

## 2016-03-17 MED ORDER — DIBUCAINE 1 % RE OINT: 1.0000 "application " | TOPICAL_OINTMENT | RECTAL | Status: DC | PRN

## 2016-03-17 MED ORDER — OXYTOCIN 40 UNITS IN LACTATED RINGERS INFUSION - SIMPLE MED
2.5000 [IU]/h | INTRAVENOUS | Status: DC
  Administered 2016-03-17: 2.5 [IU]/h via INTRAVENOUS
  Filled 2016-03-17: qty 1000

## 2016-03-17 MED ORDER — WITCH HAZEL-GLYCERIN EX PADS: 1.0000 "application " | MEDICATED_PAD | CUTANEOUS | Status: DC | PRN

## 2016-03-17 MED ORDER — PRENATAL MULTIVITAMIN CH
1.0000 | ORAL_TABLET | Freq: Every day | ORAL | Status: DC
  Administered 2016-03-18: 1 via ORAL
  Filled 2016-03-17: qty 1

## 2016-03-17 MED ORDER — LACTATED RINGERS IV SOLN
500.0000 mL | Freq: Once | INTRAVENOUS | Status: AC
  Administered 2016-03-17: 500 mL via INTRAVENOUS

## 2016-03-17 MED ORDER — FLEET ENEMA 7-19 GM/118ML RE ENEM: 1.0000 | ENEMA | Freq: Every day | RECTAL | Status: DC | PRN

## 2016-03-17 MED ORDER — OXYCODONE-ACETAMINOPHEN 5-325 MG PO TABS: 1.0000 | ORAL_TABLET | ORAL | Status: DC | PRN

## 2016-03-17 MED ORDER — SIMETHICONE 80 MG PO CHEW: 80.0000 mg | CHEWABLE_TABLET | ORAL | Status: DC | PRN

## 2016-03-17 MED ORDER — CALCIUM CARBONATE ANTACID 500 MG PO CHEW
2.0000 | CHEWABLE_TABLET | Freq: Every day | ORAL | Status: DC
  Filled 2016-03-17: qty 2

## 2016-03-17 MED ORDER — DIPHENHYDRAMINE HCL 25 MG PO CAPS: 25.0000 mg | ORAL_CAPSULE | Freq: Four times a day (QID) | ORAL | Status: DC | PRN

## 2016-03-17 MED ORDER — BENZOCAINE-MENTHOL 20-0.5 % EX AERO
1.0000 "application " | INHALATION_SPRAY | CUTANEOUS | Status: DC | PRN
  Administered 2016-03-17: 1 via TOPICAL
  Filled 2016-03-17: qty 56

## 2016-03-17 MED ORDER — LACTATED RINGERS IV SOLN: INTRAVENOUS | Status: DC

## 2016-03-17 NOTE — Anesthesia Pain Management Evaluation Note (Signed)
  CRNA Pain Management Visit Note  Patient: Danielle Goodman, 20 y.o., female  "Hello I am a member of the anesthesia team at [facility]. We have an anesthesia team available at all times to provide care throughout the hospital, including epidural management and anesthesia for C-section. I don't know your plan for the delivery whether it a natural birth, water birth, IV sedation, nitrous supplementation, doula or epidural, but we want to meet your pain goals."   1.Was your pain managed to your expectations on prior hospitalizations?   Yes   2.What is your expectation for pain management during this hospitalization?     Epidural  3.How can we help you reach that goal? epidural  Record the patient's initial score and the patient's pain goal.   Pain: 6  Pain Goal: 4 The [facility] wants you to be able to say your pain was always managed very well.  Danielle Goodman 03/17/2016

## 2016-03-17 NOTE — Lactation Note (Signed)
This note was copied from a baby's chart. Lactation Consultation Note  Patient Name: Boy Tommy MedalJenny Morris ZOXWR'UToday's Date: 03/17/2016 Reason for consult: Initial assessment   Initial consult with Exp BF mom [facility] < [facility] hour [facility]. Infant was STS with mom and cueing to feed. Mom reports she BF her almost 20 yo for 6 weeks and hopes to BF this infant longer.   Mom with large compressible breasts/areola area and everted nipples. Showed mom how to hand express and small gtt colostrum noted to right nipple.   Assisted mom in latching infant to left breast in the cross cradle hold. Infant latched easily with flanged lips, rhythmic suckles and intermittent swallows. Enc mom to compress/massage breast with feedings. Discussed using pillow support with feedings. Mom brought her Boppy pillow from home.   BF basics reviewed, Enc mom to feed infant 8-12 x in 24 hours at first feeding cues. Feeding log given with instructions for use. LC Brochure and BF Resources Hand out given, mom informed of IP/OP Services, BF Support Groups and LC phone #. Enc mom to call out to desk for feeding assistance as needed. Follow up tomorrow and prn.    Maternal Data Formula Feeding for Exclusion: No Has patient been taught Hand Expression?: Yes Does the patient have breastfeeding experience prior to this delivery?: Yes  Feeding Feeding Type: Breast Fed Length of feed: 10 min (still feeding when I lef the room)  LATCH Score/Interventions Latch: Grasps breast easily, tongue down, lips flanged, rhythmical sucking.  Audible Swallowing: Spontaneous and intermittent  Type of Nipple: Everted at rest and after stimulation  Comfort (Breast/Nipple): Soft / non-tender     Hold (Positioning): Assistance needed to correctly position infant at breast and maintain latch. Intervention(s): Breastfeeding basics reviewed;Support Pillows;Position options;Skin to skin  LATCH Score: 9  Lactation Tools  Discussed/Used WIC Program: No   Consult Status Consult Status: Follow-up Date: 03/18/16 Follow-up type: In-patient    Silas FloodSharon S [place] 03/17/2016, 5:37 PM

## 2016-03-17 NOTE — Anesthesia Procedure Notes (Signed)

## 2016-03-17 NOTE — H&P (Signed)
Danielle Goodman is a 20 y.o. female G2P1001 at 38+ in labor.  Cervical change from last check of 3 cm to 5.6 cm.  Pregnancy complicated by pt with asthma and allergies - no problems in pregnancy.  Also with ADHD.  GBBS neg.   Received flu vaccine and Tdap.  Failed glucola, nl 3hr GTT.  Also migraines with pregnancy.    OB History    Gravida Para Term Preterm AB Living   2 1 1     1    SAB TAB Ectopic Multiple Live Births           1    G1 3/15 - 38wk SVD G2 present No pap, no STDs  Past Medical History:  Diagnosis Date  . ADHD (attention deficit hyperactivity disorder)   . Asthma   . Headache    Migrainse with pregnancty   Past Surgical History:  Procedure Laterality Date  . ADENOIDECTOMY    . TONSILLECTOMY     Family History: no contrib Social History:  reports that she has quit smoking. She has never used smokeless tobacco. She reports that she does not drink alcohol or use drugs.  Meds PNV, fiorcet All NKDA     Maternal Diabetes: No Genetic Screening: Normal Maternal Ultrasounds/Referrals: Normal Fetal Ultrasounds or other Referrals:  None Maternal Substance Abuse:  Yes:  Type: Other: h/o tobacco Significant Maternal Medications:  None Significant Maternal Lab Results:  Lab values include: Group B Strep negative Other Comments:  None  Review of Systems  Constitutional: Negative.   HENT: Negative.   Eyes: Negative.   Respiratory: Negative.   Cardiovascular: Negative.   Gastrointestinal: Negative.   Genitourinary: Negative.   Musculoskeletal: Positive for back pain.  Skin: Negative.   Neurological: Negative.   Psychiatric/Behavioral: Negative.    Maternal Medical History:  Reason for admission: Contractions.   Contractions: Onset was 3-5 hours ago.   Frequency: regular.   Perceived severity is strong.    Fetal activity: Perceived fetal activity is normal.    Prenatal Complications - Diabetes: none.    Dilation: 7 Exam by:: Dr. Ellyn HackBovard Blood pressure  111/85, pulse 93, temperature 97.7 F (36.5 C), temperature source Oral, resp. rate 17, height 5\' 1"  (1.549 m), weight 78.5 kg (173 lb), last menstrual period 03/17/2015, SpO2 100 %, unknown if currently breastfeeding. Maternal Exam:  Uterine Assessment: Contraction frequency is regular.   Abdomen: Patient reports no abdominal tenderness. Fundal height is appropriate for gestation.   Estimated fetal weight is 7#.   Fetal presentation: vertex  Introitus: Normal vulva. Normal vagina.  Cervix: Cervix evaluated by digital exam.     Physical Exam  Constitutional: She is oriented to person, place, and time. She appears well-developed and well-nourished.  HENT:  Head: Normocephalic and atraumatic.  Cardiovascular: Normal rate and regular rhythm.   Respiratory: Breath sounds normal. No respiratory distress. She has no wheezes.  GI: Soft. Bowel sounds are normal. She exhibits no distension. There is no tenderness.  Musculoskeletal: Normal range of motion.  Neurological: She is alert and oriented to person, place, and time.  Skin: Skin is warm and dry.  Psychiatric: She has a normal mood and affect. Her behavior is normal.    Prenatal labs: ABO, Rh: --/--/O POS (11/09 1210) Antibody: NEG (11/09 1210) Rubella:  immune RPR: Nonreactive (08/21 0000)  HBsAg:   neg HIV: Non-reactive (08/21 0000)  GBS: Negative (10/24 0000)   Hgb 11.3/Plt 329/UrCx + - repeat neg/Varicella immune/First trimester Screen WNL, nl NT/glucola  163 - 3hr GTT WNL/varicella immune/  First trimester screen WNL, nl NT Nl anat, female  Assessment/Plan: 20yo G2P1001 in labor Epidural for pain Augment prn Expect SVD  Bovard-Stuckert, Giomar Gusler 03/17/2016, 1:50 PM

## 2016-03-17 NOTE — Progress Notes (Signed)
Patient ID: Danielle ArdsJenny N Goodman, female   DOB: 07/26/1995, 20 y.o.   MRN: [id number]   Late entry - AROM for clear fluid, 7cm  Late entry - pt complete with pressure, will push, plan for SVD

## 2016-03-17 NOTE — Anesthesia Preprocedure Evaluation (Signed)
Anesthesia Evaluation  Patient identified by MRN, date of birth, ID band Patient awake    Reviewed: Allergy & Precautions, H&P , Patient's Chart, lab work & pertinent test results  Airway Mallampati: II  TM Distance: >3 FB Neck ROM: full    Dental  (+) Teeth Intact   Pulmonary asthma , former smoker,    breath sounds clear to auscultation       Cardiovascular  Rhythm:regular Rate:Normal     Neuro/Psych    GI/Hepatic   Endo/Other    Renal/GU      Musculoskeletal   Abdominal   Peds  Hematology   Anesthesia Other Findings   Rare inhaler use   Reproductive/Obstetrics (+) Pregnancy                             Anesthesia Physical Anesthesia Plan  ASA: II  Anesthesia Plan: Epidural   Post-op Pain Management:    Induction:   Airway Management Planned:   Additional Equipment:   Intra-op Plan:   Post-operative Plan:   Informed Consent: I have reviewed the patients History and Physical, chart, labs and discussed the procedure including the risks, benefits and alternatives for the proposed anesthesia with the patient or authorized representative who has indicated his/her understanding and acceptance.   Dental Advisory Given  Plan Discussed with:   Anesthesia Plan Comments: (Labs checked- platelets confirmed with RN in room. Fetal heart tracing, per RN, reported to be stable enough for sitting procedure. Discussed epidural, and patient consents to the procedure:  included risk of possible headache,backache, failed block, allergic reaction, and nerve injury. This patient was asked if she had any questions or concerns before the procedure started.)        Anesthesia Quick Evaluation

## 2016-03-18 LAB — CBC
HEMATOCRIT: 32.1 % — AB (ref 36.0–46.0)
HEMOGLOBIN: 10.9 g/dL — AB (ref 12.0–15.0)
MCH: 29.2 pg (ref 26.0–34.0)
MCHC: 34 g/dL (ref 30.0–36.0)
MCV: 86.1 fL (ref 78.0–100.0)
Platelets: 213 10*3/uL (ref 150–400)
RBC: 3.73 MIL/uL — ABNORMAL LOW (ref 3.87–5.11)
RDW: 15.3 % (ref 11.5–15.5)
WBC: 10.3 10*3/uL (ref 4.0–10.5)

## 2016-03-18 LAB — RPR: RPR: NONREACTIVE

## 2016-03-18 MED ORDER — IBUPROFEN 600 MG PO TABS: 600.0000 mg | ORAL_TABLET | Freq: Four times a day (QID) | ORAL | 0 refills | Status: DC

## 2016-03-18 MED ORDER — IBUPROFEN 100 MG/5ML PO SUSP
600.0000 mg | Freq: Four times a day (QID) | ORAL | Status: DC
  Administered 2016-03-19 (×2): 600 mg via ORAL
  Filled 2016-03-18 (×3): qty 30

## 2016-03-18 NOTE — Progress Notes (Signed)
Infant is not being discharged so mother wanted to stay as a patient also. Dr. Jackelyn KnifeMeisinger notified and ordered to cancel discharge.

## 2016-03-18 NOTE — Lactation Note (Signed)
This note was copied from a baby's chart. Lactation Consultation Note  Patient Name: Danielle Goodman VHQIO'NToday's Date: 03/18/2016 Reason for consult: Follow-up assessment Baby at 28 hr of life. Mom was requesting lactation. She is afraid that baby is always hungry and she is offering formula but wants to ebf. The baby was laying peacefully in mom's arms but after rubbing his mouth with her finger for a minute baby tried to suck her finger but as soon as she stopped he want back to quiet alert state. She stated she had just fed him formula "right before you walked in". She thinks she needs a larger flange for the Harmony, the 24mm appears to fit well at this time. She is reporting bilateral sore nipples. She stated she has coconut oil and comfort gels. Discussed baby behavior, feeding frequency, baby belly size, voids, wt loss, breast changes, and nipple care. Mom is worried that she is going to get home and not be able to bf. She thinks that "people" will look down on her for not bf. At this visit she is wearing a bra and mesh underpants, pacing all over the room, and talking really fast. At times she would cry. Mom may benefit from a social work consult.     Maternal Data    Feeding Feeding Type: Breast Fed  LATCH Score/Interventions Latch: Grasps breast easily, tongue down, lips flanged, rhythmical sucking. (per mom)  Audible Swallowing: A few with stimulation Intervention(s): Skin to skin  Type of Nipple: Everted at rest and after stimulation (per mom)  Comfort (Breast/Nipple): Filling, red/small blisters or bruises, mild/mod discomfort  Problem noted: Mild/Moderate discomfort Interventions (Mild/moderate discomfort):  (coconut oil)  Hold (Positioning): Assistance needed to correctly position infant at breast and maintain latch. Intervention(s): Support Pillows  LATCH Score: 7  Lactation Tools Discussed/Used     Consult Status Consult Status: Follow-up Date: 03/19/16 Follow-up  type: In-patient    Rulon Eisenmengerlizabeth E Lazara Grieser 03/18/2016, 8:51 PM

## 2016-03-18 NOTE — Lactation Note (Signed)
This note was copied from a baby's chart. Lactation Consultation Note  Patient Name: Danielle [name] YNWGN'FToday's Date: 03/18/2016 Reason for consult: Follow-up assessment;Breast/nipple pain  Mom requesting assistance with latch.  Baby 16 hrs old and has been cueing and wanting to eat for last hour.  Mom has compressible areola, and hand expression yields colostrum easily. Right nipple little abraded on tip.  Mom had baby in cross cradle hold, but was hunched over and baby was fully clothed.  Talked about undressing baby, and providing better support higher at breast level.  Tried first in cross cradle, and then after Mom complaining of pinching.  Baby appears deep on the areola, using deep jaw extensions.  Placed Mom in laid back position, and baby latched easily.  Mom encouraged to relax as she is very worried and tense.  Basic teaching done and reassurance given that cluster feeding is normal in the newborn period.  EBM to nipples post feeding.  Encouraged continued STS and feeding on cue. Pediatrician came in to examine baby.   Mom to call prn for assistance as needed.    Consult Status Consult Status: Follow-up Date: 03/19/16 Follow-up type: In-patient    Judee ClaraSmith, Adalai Perl E 03/18/2016, 9:15 AM

## 2016-03-18 NOTE — Anesthesia Postprocedure Evaluation (Signed)
Anesthesia Post Note  Patient: Danielle Goodman  Procedure(s) Performed: * No procedures listed *  Patient location during evaluation: Mother Baby Anesthesia Type: Epidural Level of consciousness: awake and alert Pain management: pain level controlled Vital Signs Assessment: post-procedure vital signs reviewed and stable Respiratory status: spontaneous breathing, nonlabored ventilation and respiratory function stable Cardiovascular status: stable Postop Assessment: no headache, no backache, epidural receding and patient able to bend at knees Anesthetic complications: no     Last Vitals:  Vitals:   03/17/16 2340 03/18/16 0540  BP: (!) 130/27 115/64  Pulse: 86 84  Resp: 18 18  Temp: 37.3 C 36.7 C    Last Pain:  Vitals:   03/18/16 0555  TempSrc:   PainSc: 2    Pain Goal: Patients Stated Pain Goal: 2 (03/17/16 2300)               Rica RecordsICKELTON,[name]

## 2016-03-18 NOTE — Discharge Instructions (Signed)
As per discharge pamphlet °

## 2016-03-18 NOTE — Lactation Note (Signed)
This note was copied from a baby's chart. Lactation Consultation Note  Patient Name: Danielle Tommy MedalJenny Morris ZOXWR'UToday's Date: 03/18/2016 Reason for consult: Follow-up assessment   With this mom of a term baby, now 6817 hours old. Output WNL so far. Mom has easily expressed colostrum and she reports having trouble getting baby to eat. On exam of baby's mouth, he may have some tongue restriction, and posterior short frenulum, but with spoon feeding her was able to extend his tongue passed the gum line significantly. After spoon feeding, I showed mom how to apply a 20 nipple shield, and then relatched the baby. He latched deeply, with strong, rhythmic sukckle, and very good breast movement. I shoed both mom and dad how to advance baby gently beyond the nipple of shileld, to obtain a deep latch. I tried to calm mom by saiyng her baby appears to be doing well, and if he continues to be fussy, to call lactation again, and we can show her how to supplemnt with alimentum, in the nipple shield advised mom to keep baby skin to skin, and have baby watch the baby, while she naps.    Maternal Data    Feeding Feeding Type: Breast Fed Length of feed: 10 min (on and off sucking, sleepy at times, did well with 20 nipple shield)  LATCH Score/Interventions Latch: Grasps breast easily, tongue down, lips flanged, rhythmical sucking. Intervention(s): Adjust position;Assist with latch;Breast massage;Breast compression  Audible Swallowing: A few with stimulation Intervention(s): Skin to skin;Hand expression;Alternate breast massage  Type of Nipple: Everted at rest and after stimulation  Comfort (Breast/Nipple): Filling, red/small blisters or bruises, mild/mod discomfort (nipples slightly pinched after latch without shield, appear normal, but mom states very tender)  Problem noted: Mild/Moderate discomfort Interventions  (Cracked/bleeding/bruising/blister): Expressed breast milk to nipple Interventions (Mild/moderate  discomfort): Hand massage;Hand expression  Hold (Positioning): Assistance needed to correctly position infant at breast and maintain latch. Intervention(s): Breastfeeding basics reviewed;Support Pillows;Position options;Skin to skin  LATCH Score: 7  Lactation Tools Discussed/Used Tools: Nipple Shields Nipple shield size: 20   Consult Status Consult Status: Follow-up Date: 03/19/16 Follow-up type: In-patient    Alfred LevinsLee, Lametria Klunk Anne 03/18/2016, 10:41 AM

## 2016-03-18 NOTE — Progress Notes (Signed)
CSW acknowledged consult and attempted to meet with MOB.  MOB was being attended to by bedside nurse when CSW arrived.  CSW will attempt to meet with MOB at a later time.  Alithea Lapage Boyd-Gilyard, MSW, LCSW Clinical Social Work [phone]   

## 2016-03-18 NOTE — Progress Notes (Signed)
PPD #1 Doing well, wants to go home Afeb, VSS Fundus firm Will d/c home if baby ok to go this pm

## 2016-03-18 NOTE — Lactation Note (Signed)
This note was copied from a baby's chart. Lactation Consultation Note Mom is an anxious person. [facility] Mom unsure of herself BF, had many questions. Mom stated she BF in cradle, cross cradle and football. Mom had baby laying in cradle position on boppy. Assisted in football position. Encouraged to relax, take deep breath. Educated body alignment for baby and mom. Educated on props, pillow, and support,as well as safety. Mom has good intact everted nipples. Hand expression w/colostrum noted. Swallows heard. Reassuring mom frequently. Mom saying its so stressful, but she really wants to BF. Encouraged mom to think how beautiful her baby is and not how stressful it is and try to relax more so colostrum can flow more. Encouraged to feel breast before and after BF for transfer as well as hearing swallows. Mom thankful for assistance. Reported to RN of consult.  Patient Name: Danielle Goodman ONGEX'BToday's Date: 03/18/2016 Reason for consult: Follow-up assessment   Maternal Data    Feeding Feeding Type: Breast Fed Length of feed: 15 min (still BF)  LATCH Score/Interventions Latch: Grasps breast easily, tongue down, lips flanged, rhythmical sucking. Intervention(s): Adjust position;Assist with latch;Breast massage;Breast compression  Audible Swallowing: Spontaneous and intermittent  Type of Nipple: Everted at rest and after stimulation  Comfort (Breast/Nipple): Soft / non-tender     Hold (Positioning): Assistance needed to correctly position infant at breast and maintain latch. Intervention(s): Breastfeeding basics reviewed;Support Pillows;Position options;Skin to skin  LATCH Score: 9  Lactation Tools Discussed/Used     Consult Status Consult Status: Follow-up Date: 03/18/16 (in pm) Follow-up type: In-patient    Shamir Sedlar, Diamond NickelLAURA G 03/18/2016, 3:27 AM

## 2016-03-19 MED ORDER — IBUPROFEN 100 MG/5ML PO SUSP: 600.0000 mg | Freq: Four times a day (QID) | ORAL | 1 refills | Status: DC

## 2016-03-19 NOTE — Progress Notes (Signed)
PPD #2 Doing well, stayed because of baby Afeb, VSS D/c home

## 2016-03-19 NOTE — Lactation Note (Addendum)
This note was copied from a baby's chart. Lactation Consultation Note: Mother had infant latched to the left breast when I arrived in room for follow visit. Infant had wide open flanged lips. When ask mother if she was hearing swallows, she states afew. Mother taught breast compression and infant began to swallow. Observed infant with intermittent swallow. Mother showed hand expression with colostrum dripping from nipple. Mother independently latched infant to the alternate breast, Mother advised to continue to feed infant 8-12 times in 24 hours and with all feeding cues. Mother also advised of S/S of engorgement and Mastitis.  Suggested that mother post pump 15 min on each breast and give back colostrum to infant.  Mother informed of available LC services, telephone question line, support group , out pt visits and community support.   Patient Name: [name] Date: 03/19/2016 Reason for consult: Follow-up assessment   Maternal Data    Feeding Feeding Type: Breast Fed  LATCH Score/Interventions Latch: Grasps breast easily, tongue down, lips flanged, rhythmical sucking. Intervention(s): Breast compression  Audible Swallowing: A few with stimulation Intervention(s): Alternate breast massage  Type of Nipple: Everted at rest and after stimulation  Comfort (Breast/Nipple): Soft / non-tender  Problem noted: Mild/Moderate discomfort Interventions  (Cracked/bleeding/bruising/blister): Hand pump  Hold (Positioning): No assistance needed to correctly position infant at breast. Intervention(s): Support Pillows  LATCH Score: 9  Lactation Tools Discussed/Used     Consult Status      Michel BickersKendrick, Justan Gaede McCoy 03/19/2016, 9:25 AM

## 2016-03-19 NOTE — Discharge Summary (Signed)
OB Discharge Summary     Patient Name: Danielle ArdsJenny N Morris DOB: 08/10/1995 MRN: [id number]  Date of admission: 03/17/2016 Delivering MD: Sherian ReinBOVARD-STUCKERT, JODY   Date of discharge: 03/19/2016  Admitting diagnosis: LABOR Intrauterine pregnancy: [id number]     Secondary diagnosis:  Principal Problem:   SVD (spontaneous vaginal delivery) Active Problems:   Normal labor and delivery     Discharge diagnosis: Term Pregnancy Delivered                                    Hospital course:  Onset of Labor With Vaginal Delivery     20 y.o. yo [id number] at 6791w5d was admitted in Active Labor on 03/17/2016. Patient had an uncomplicated labor course as follows:  Membrane Rupture Time/Date: 1:31 PM ,03/17/2016   Intrapartum Procedures: Episiotomy: None [1]                                         Lacerations:  Labial [10]  Patient had a delivery of a Viable infant. 03/17/2016  Information for the patient's newborn:  Leafy RoMorris, Boy Jameka [[id number]][[id number]]  Delivery Method: Vaginal, Spontaneous Delivery (Filed from Delivery Summary)    Pateint had an uncomplicated postpartum course.  She is ambulating, tolerating a regular diet, passing flatus, and urinating well. Patient is discharged home in stable condition on 03/19/16.    Physical exam Vitals:   03/17/16 2340 03/18/16 0540 03/18/16 1735 03/19/16 0545  BP: (!) 130/27 115/64 123/77 128/68  Pulse: 86 84 86 79  Resp: 18 18 18 19   Temp: 99.1 F (37.3 C) 98.1 F (36.7 C) 98.1 F (36.7 C) 98.6 F (37 C)  TempSrc: Oral Oral  Oral  SpO2: 100%     Weight:      Height:       General: alert Lochia: appropriate Uterine Fundus: firm  Labs: Lab Results  Component Value Date   WBC 10.3 03/18/2016   HGB 10.9 (L) 03/18/2016   HCT 32.1 (L) 03/18/2016   MCV 86.1 03/18/2016   PLT 213 03/18/2016   CMP Latest Ref Rng & Units 03/14/2016  Glucose 65 - 99 mg/dL 841(L117(H)  BUN 6 - 20 mg/dL 9  Creatinine 2.440.44 - 0.101.00 mg/dL 2.720.53  Sodium 536135 - 644145 mmol/L 134(L)  Potassium  3.5 - 5.1 mmol/L 4.0  Chloride 101 - 111 mmol/L 105  CO2 22 - 32 mmol/L 21(L)  Calcium 8.9 - 10.3 mg/dL 9.5  Total Protein 6.5 - 8.1 g/dL 6.0(L)  Total Bilirubin 0.3 - 1.2 mg/dL 0.5  Alkaline Phos 38 - 126 U/L 150(H)  AST 15 - 41 U/L 23  ALT 14 - 54 U/L 15    Discharge instruction: per After Visit Summary and "Baby and Me Booklet".  After visit meds:    Medication List    TAKE these medications   acetaminophen 325 MG tablet Commonly known as:  TYLENOL Take 650 mg by mouth every 6 (six) hours as needed for headache.   calcium carbonate 500 MG chewable tablet Commonly known as:  TUMS - dosed in mg elemental calcium Chew 2 tablets by mouth daily as needed for heartburn.   ibuprofen 600 MG tablet Commonly known as:  ADVIL,MOTRIN Take 1 tablet (600 mg total) by mouth every 6 (six) hours.   ibuprofen 100 MG/5ML suspension Commonly known as:  ADVIL,MOTRIN Take 30 mLs (600 mg total) by mouth every 6 (six) hours.   prenatal multivitamin Tabs tablet Take 1 tablet by mouth daily at 12 noon.       Diet: routine diet  Activity: Advance as tolerated. Pelvic rest for 6 weeks.   Outpatient follow up:4 weeks   Newborn Data: Live born female  Birth Weight: 8 lb 9 oz (3884 g) APGAR: 8, 9  Baby Feeding: Breast Disposition:home with mother   03/19/2016 Zenaida NieceMEISINGER,Mylei Brackeen D, MD

## 2016-03-19 NOTE — Clinical Social Work Maternal (Signed)
CLINICAL SOCIAL WORK MATERNAL/CHILD NOTE  Patient Details  Name: Danielle Goodman MRN: [id number] Date of Birth: 01-Nov-1995  Date:  03/19/2016  Clinical Social Worker Initiating Note:  Ferdinand Lango Lorea Kupfer, MSW, LCSW-A  Date/ Time Initiated:  03/19/16/0940     Child's Name:  Loney Hering   Legal Guardian:  Other (Comment) (Not established by court system; MOB and FOB parent collectively )   Need for Interpreter:  None   Date of Referral:  03/17/16     Reason for Referral:  Other (Comment) (MOB hx of severe anxiety and expressed interest in counseling )   Referral Source:  Physician   Address:  [address], [place] [address]  Phone number:  [phone]   Household Members:  Self, Significant Other, Minor Children   Natural Supports (not living in the home):  Friends, Immediate Family, Parent   Professional Supports: None   Employment: Unemployed   Type of Work: Unemployed   Education:  9 to 11 years   Financial Resources:  Medicaid   Other Resources:  None    Cultural/Religious Considerations Which May Impact Care:  None reported at this time.   Strengths:  Ability to meet basic needs , Compliance with medical plan , Home prepared for child , Pediatrician chosen  [facility] )   Risk Factors/Current Problems:  Other (Comment) (MOB hx of severe anxiety and PPD during first pregnancy )   Cognitive State:  Able to Concentrate , Insightful , Alert , Goal Oriented    Mood/Affect:  Calm , Comfortable , Interested , Relaxed    CSW Assessment: CSW met with MOB at bedside to complete assessment. At the time of this write's arrival, MOB was feeding baby and conversing with visitor in room. This Probation officer explained role and asked if it was ok to complete assessment with visitor in the room. MOB noted to this writer it was ok to proceed with assessment. This Probation officer explained reasoning for visit being due to MOB's hx of severe anxiety and alleged  expressed interest in counseling.   Upon this writer's assessment of MOB's anxiety, MOB noted she has been dealing with anxiety since she was a kid. MOB notes she was never formally dx by a psychiatrist or mental health professional; however, she was once hospitalized due to having a anxiety induced panic attack as a child. MOB notes this was not a inpatient psychiatric hospitalization. This Probation officer assessed MOB's current mood/feelings regarding new baby. MOB notes she feels better today than she was. MOB notes she was initially worried due to baby having to take supplemental feeding versus breast and/or bottle. When this writer asked MOB to identify coping skills to manage her anxiety MOB notes being around her support system (FOB, immediate family and friends) helps her anxiety. MOB notes she has never taken medication to treat it and is not interested in medication as she has been successful with managing it on her own using coping skills. This Probation officer inquired if there are specific triggers for her anxiety. MOB noted there are no specific triggers but it is anytime she gets over worried about something.   This Probation officer noted to MOB that although this is her second child, every child growth and changes are different thus, her experience will not mirror one another. 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 This Probation officer reviewed PPD symptoms and preventative  measures in addition to discussing SIDS precautions. MOB verbalized understanding.   At this time, no other needs were addressed or requested and CSW has no barriers to d/c, thus case closed to this CSW.   CSW Plan/Description:  No Further Intervention Required/No Barriers to Discharge   Oda Cogan, MSW, [facility]  Office: [phone]

## 2016-03-19 NOTE — Plan of Care (Signed)
Problem: Coping: Goal: Ability to cope will improve Outcome: Completed/Met Date Met: 03/19/16 Patient displays anxiety related infant feeding and frequently asks care providers for reassurance that her baby is getting enough. She has been assisted with breastfeeding by Lactation Consultants and nursing staff. She reports experience with successfully breastfeeding her first baby for 6 weeks and pumping adequate volumes of milk. Breast are soft and small colostrum noted so mother is supplementing with formula pc via syringe feeding and plans to pump with a manual breast pump for additional stimulation to promote milk supply and provide calories. Social Worker consult has been made related to patient's coping and anxiety. Patient is very cheerful and excited about going home today.

## 2016-03-28 ENCOUNTER — Inpatient Hospital Stay (HOSPITAL_COMMUNITY): Payer: Medicaid Other

## 2016-12-30 ENCOUNTER — Emergency Department (HOSPITAL_COMMUNITY)
Admission: EM | Admit: 2016-12-30 | Discharge: 2016-12-30 | Disposition: A | Discharge status: Home / Self Care | Payer: Medicaid Other | Attending: Emergency Medicine | Admitting: Emergency Medicine

## 2016-12-30 ENCOUNTER — Encounter (HOSPITAL_COMMUNITY): Payer: Self-pay | Admitting: Emergency Medicine

## 2016-12-30 DIAGNOSIS — J45909 Unspecified asthma, uncomplicated: Secondary | ICD-10-CM | POA: Insufficient documentation

## 2016-12-30 DIAGNOSIS — Z87891 Personal history of nicotine dependence: Secondary | ICD-10-CM | POA: Insufficient documentation

## 2016-12-30 DIAGNOSIS — F909 Attention-deficit hyperactivity disorder, unspecified type: Secondary | ICD-10-CM | POA: Insufficient documentation

## 2016-12-30 DIAGNOSIS — R51 Headache: Secondary | ICD-10-CM | POA: Insufficient documentation

## 2016-12-30 DIAGNOSIS — R519 Headache, unspecified: Secondary | ICD-10-CM

## 2016-12-30 LAB — POC URINE PREG, ED: Preg Test, Ur: NEGATIVE

## 2016-12-30 NOTE — ED Triage Notes (Signed)
Patient c/o headache that is generalized and constant over the past week. Patient reports that not like her typical migraine but worried and wants to know why she having this constant headache.

## 2016-12-31 NOTE — ED Provider Notes (Signed)
[facility]-EMERGENCY DEPT Provider Note   CSN: [id number] Arrival date & time: 12/30/16  1017     History   Chief Complaint Chief Complaint  Patient presents with  . heaadache    HPI Danielle Goodman is a 21 y.o. female.  HPI   Presents with concern for headache. Began slowly one week ago. Has been constant, a dull ache, present diffusely.  Tylenol helps with the pain, however it returns. Notes associated nausea. No associated neuro symptoms. Not worse at any particular time.  Is worried about etiology.  Reports it is mild.  No trauma. No fevers. Similar to HA during pregnancy.  Past Medical History:  Diagnosis Date  . ADHD (attention deficit hyperactivity disorder)   . Asthma   . Headache    Migrainse with pregnancty  . SVD (spontaneous vaginal delivery) 03/17/2016    Patient Active Problem List   Diagnosis Date Noted  . Normal labor and delivery 03/17/2016  . SVD (spontaneous vaginal delivery) 03/17/2016  . Premature rupture of membranes 07/10/2013    Past Surgical History:  Procedure Laterality Date  . ADENOIDECTOMY    . TONSILLECTOMY      OB History    Gravida Para Term Preterm AB Living   2 2 2     2    SAB TAB Ectopic Multiple Live Births         0 2       Home Medications    Prior to Admission medications   Medication Sig Start Date End Date Taking? Authorizing Provider  acetaminophen (TYLENOL) 325 MG tablet Take 650 mg by mouth every 4 (four) hours as needed for mild pain, moderate pain, fever or headache.    Yes [provider]  etonogestrel (NEXPLANON) 68 MG IMPL implant 1 each by Subdermal route once. Placed 05/2016   Yes [provider]    Family History No family history on file.  Social History Social History  Substance Use Topics  . Smoking status: Former Games developer  . Smokeless tobacco: Never Used  . Alcohol use No     Allergies   Patient has no known allergies.   Review of Systems Review of Systems  Constitutional:  Negative for fever.  HENT: Negative for sore throat.   Eyes: Negative for visual disturbance.  Respiratory: Negative for cough and shortness of breath.   Cardiovascular: Negative for chest pain.  Gastrointestinal: Positive for nausea. Negative for abdominal pain and vomiting.  Genitourinary: Negative for difficulty urinating.  Musculoskeletal: Negative for back pain and neck pain.  Skin: Negative for rash.  Neurological: Positive for headaches. Negative for dizziness, syncope, facial asymmetry, speech difficulty, weakness and numbness.     Physical Exam Updated Vital Signs BP 107/78 (BP Location: Right Arm)   Pulse 86   Temp 97.6 F (36.4 C) (Oral)   Resp 16   Ht 5\' 2"  (1.575 m)   Wt 59 kg (130 lb)   SpO2 99%   BMI 23.78 kg/m   Physical Exam  Constitutional: She is oriented to person, place, and time. She appears well-developed and well-nourished. No distress.  HENT:  Head: Normocephalic and atraumatic.  Mouth/Throat: No oropharyngeal exudate.  Eyes: Pupils are equal, round, and reactive to light. Conjunctivae and EOM are normal.  Neck: Normal range of motion.  Left preauricular lymph node  Cardiovascular: Normal rate, regular rhythm, normal heart sounds and intact distal pulses.  Exam reveals no gallop and no friction rub.   No murmur heard. Pulmonary/Chest: Effort normal and  breath sounds normal. No respiratory distress. She has no wheezes. She has no rales.  Abdominal: Soft. She exhibits no distension. There is no tenderness. There is no guarding.  Musculoskeletal: She exhibits no edema or tenderness.  Neurological: She is alert and oriented to person, place, and time. She has normal strength. No cranial nerve deficit or sensory deficit. She displays a negative Romberg sign. Coordination and gait normal. GCS eye subscore is 4. GCS verbal subscore is 5. GCS motor subscore is 6.  Skin: Skin is warm and dry. No rash noted. She is not diaphoretic. No erythema.  Nursing note  and vitals reviewed.    ED Treatments / Results  Labs (all labs ordered are listed, but only abnormal results are displayed) Labs Reviewed  POC URINE PREG, ED    EKG  EKG Interpretation None       Radiology No results found.  Procedures Procedures (including critical care time)  Medications Ordered in ED Medications - No data to display   Initial Impression / Assessment and Plan / ED Course  I have reviewed the triage vital signs and the nursing notes.  Pertinent labs & imaging results that were available during my care of the patient were reviewed by me and considered in my medical decision making (see chart for details).     21yo female with history of headaches during pregnancy presents with concern of headache.  Headache began slowly, no trauma, no fevers, and normal neurologic exam and have low suspicion for [facility], SDH or meningitis.   Discussed most likely tension HA vs migraine, however if headaches continues recommend close outpatient follow up, consideration of outpatient imaging.  Patient discharged in stable condition with understanding of reasons to return.    Final Clinical Impressions(s) / ED Diagnoses   Final diagnoses:  Acute nonintractable headache, unspecified headache type    New Prescriptions Discharge Medication List as of 12/30/2016 12:01 PM       Alvira Monday, MD 12/31/16 0111

## 2017-01-05 ENCOUNTER — Emergency Department (HOSPITAL_COMMUNITY): Payer: Self-pay

## 2017-01-05 ENCOUNTER — Encounter (HOSPITAL_COMMUNITY): Payer: Self-pay | Admitting: Emergency Medicine

## 2017-01-05 ENCOUNTER — Emergency Department (HOSPITAL_COMMUNITY)
Admission: EM | Admit: 2017-01-05 | Discharge: 2017-01-05 | Disposition: A | Discharge status: Home / Self Care | Payer: Self-pay | Attending: Emergency Medicine | Admitting: Emergency Medicine

## 2017-01-05 DIAGNOSIS — Z87891 Personal history of nicotine dependence: Secondary | ICD-10-CM | POA: Insufficient documentation

## 2017-01-05 DIAGNOSIS — J45909 Unspecified asthma, uncomplicated: Secondary | ICD-10-CM | POA: Insufficient documentation

## 2017-01-05 DIAGNOSIS — G4489 Other headache syndrome: Secondary | ICD-10-CM | POA: Insufficient documentation

## 2017-01-05 DIAGNOSIS — F909 Attention-deficit hyperactivity disorder, unspecified type: Secondary | ICD-10-CM | POA: Insufficient documentation

## 2017-01-05 NOTE — ED Triage Notes (Signed)
Pt reports headache all over her head x 2 weeks. Pain is intermittently sharp, then occasional pressure. Pt denies N/V, dizziness or LOC

## 2017-01-05 NOTE — ED Provider Notes (Signed)
[facility]-EMERGENCY DEPT Provider Note   CSN: [id number] Arrival date & time: 01/05/17  1245     History   Chief Complaint Chief Complaint  Patient presents with  . Headache    HPI Danielle Goodman is a 21 y.o. female who presents to the ED with complaint of headache. She reports the symptoms started 2 weeks ago. She has taken only tylenol because she is breast feeding. The headache generalized but the worst is in the back of the head. Patient denies n/v, dizziness or any other problems.   Patient was here for same 4 days ago and had normal exam and treated with tylenol.   The history is provided by the patient. No language interpreter was used.  Headache   This is a new problem. The current episode started more than 1 week ago. The problem occurs constantly. The problem has been gradually worsening. The headache is associated with nothing. The pain is located in the occipital region. The quality of the pain is described as sharp and dull. The pain is at a severity of 4/10. The pain does not radiate. Pertinent negatives include no anorexia, no fever, no malaise/fatigue, no chest pressure, no near-syncope, no palpitations, no syncope, no shortness of breath, no nausea and no vomiting. Treatments tried: tylenol without relief.    Past Medical History:  Diagnosis Date  . ADHD (attention deficit hyperactivity disorder)   . Asthma   . Headache    Migrainse with pregnancty  . SVD (spontaneous vaginal delivery) 03/17/2016    Patient Active Problem List   Diagnosis Date Noted  . Normal labor and delivery 03/17/2016  . SVD (spontaneous vaginal delivery) 03/17/2016  . Premature rupture of membranes 07/10/2013    Past Surgical History:  Procedure Laterality Date  . ADENOIDECTOMY    . TONSILLECTOMY      OB History    Gravida Para Term Preterm AB Living   2 2 2     2    SAB TAB Ectopic Multiple Live Births         0 2       Home Medications    Prior to Admission medications     Medication Sig Start Date End Date Taking? Authorizing Provider  acetaminophen (TYLENOL) 325 MG tablet Take 650 mg by mouth every 4 (four) hours as needed for mild pain, moderate pain, fever or headache.     [provider]  etonogestrel (NEXPLANON) 68 MG IMPL implant 1 each by Subdermal route once. Placed 05/2016    [provider]    Family History Family History  Problem Relation Age of Onset  . Hypertension Mother     Social History Social History  Substance Use Topics  . Smoking status: Former Games developermoker  . Smokeless tobacco: Never Used  . Alcohol use No     Allergies   Patient has no known allergies.   Review of Systems Review of Systems  Constitutional: Negative for chills, fever and malaise/fatigue.  HENT: Negative.   Eyes: Negative for photophobia, discharge, redness, itching and visual disturbance.  Respiratory: Negative for chest tightness and shortness of breath.   Cardiovascular: Negative for palpitations, syncope and near-syncope.  Gastrointestinal: Negative for abdominal pain, anorexia, nausea and vomiting.  Genitourinary: Negative for dysuria, frequency, urgency, vaginal bleeding and vaginal discharge.  Musculoskeletal: Negative for back pain, neck pain and neck stiffness.  Skin: Negative for rash.  Neurological: Positive for headaches. Negative for dizziness and syncope.  Psychiatric/Behavioral: Negative for confusion. Nervous/anxious: hx of  anxiety.      Physical Exam Updated Vital Signs BP 119/73 (BP Location: Right Arm)   Pulse 85   Temp 98.3 F (36.8 C) (Oral)   Resp 16   Ht 5\' 2"  (1.575 m)   Wt 59 kg (130 lb)   SpO2 99%   Breastfeeding? Yes   BMI 23.78 kg/m   Physical Exam  Constitutional: She is oriented to person, place, and time. She appears well-developed and well-nourished. No distress.  HENT:  Head: Normocephalic and atraumatic.  Right Ear: Tympanic membrane normal.  Left Ear: Tympanic membrane normal.  Nose: Nose  normal.  Mouth/Throat: Uvula is midline, oropharynx is clear and moist and mucous membranes are normal.  Eyes: Conjunctivae and EOM are normal.  Neck: Normal range of motion. Neck supple.  Cardiovascular: Normal rate and regular rhythm.   Pulmonary/Chest: Effort normal. She has no wheezes. She has no rales.  Abdominal: Soft. Bowel sounds are normal. She exhibits no mass. There is no tenderness.  Musculoskeletal: Normal range of motion. She exhibits no edema.  Radial and pedal pulses strong, adequate circulation, good touch sensation.  Neurological: She is alert and oriented to person, place, and time. She has normal strength. No cranial nerve deficit or sensory deficit. She displays a negative Romberg sign. Gait normal.  Reflex Scores:      Bicep reflexes are 2+ on the right side and 2+ on the left side.      Brachioradialis reflexes are 2+ on the right side and 2+ on the left side.      Patellar reflexes are 2+ on the right side and 2+ on the left side. Rapid alternating movement without difficulty. Stands on one foot without difficulty.  Skin: Skin is warm and dry.  Psychiatric: She has a normal mood and affect. Her behavior is normal.     ED Treatments / Results  Labs (all labs ordered are listed, but only abnormal results are displayed) Labs Reviewed - No data to display Radiology Ct Head Wo Contrast  Result Date: 01/05/2017 CLINICAL DATA:  Chronic headache. Normal neuro exam. Headache is diffuse and has been present for 2 weeks, intermittent sharp. EXAM: CT HEAD WITHOUT CONTRAST TECHNIQUE: Contiguous axial images were obtained from the base of the skull through the vertex without intravenous contrast. COMPARISON:  Brain MRI 06/20/2013 FINDINGS: Brain: Normal appearance. No evidence of acute infarction, hemorrhage, hydrocephalus, extra-axial collection or mass lesion/mass effect. Vascular: No hyperdense vessel or unexpected calcification. Skull: Normal. Negative for fracture or focal  lesion. Sinuses/Orbits: Negative IMPRESSION: Normal head CT. Electronically Signed   By: Marnee Spring M.D.   On: 01/05/2017 14:52    Procedures Procedures (including critical care time)  Medications Ordered in ED Medications - No data to display   Initial Impression / Assessment and Plan / ED Course  I have reviewed the triage vital signs and the nursing notes.  Pertinent  imaging results that were available during my care of the patient were reviewed by me and considered in my medical decision making (see chart for details).  21 y.o. female with headache that started 2 weeks ago stable for d/c with normal head CT and normal neuro exam. Encouraged patient to f/u with neurologist and referral given. Return precautions discussed.   Final Clinical Impressions(s) / ED Diagnoses   Final diagnoses:  Other headache syndrome    New Prescriptions New Prescriptions   No medications on file     Janne Napoleon, NP 01/05/17 1514    [place],  Caryn Bee, MD 01/05/17 [phone]

## 2017-01-05 NOTE — Discharge Instructions (Signed)
Your CT scan today is normal, your physical exam shows no neurological deficits. You will need to follow up with the neurologist for further evaluation and studies for your persistent headache.  You cant take Advil in addition to the tylenol if needed for your headache.

## 2017-05-21 ENCOUNTER — Emergency Department (HOSPITAL_COMMUNITY)
Admission: EM | Admit: 2017-05-21 | Discharge: 2017-05-21 | Disposition: A | Discharge status: Home / Self Care | Payer: Self-pay | Attending: Emergency Medicine | Admitting: Emergency Medicine

## 2017-05-21 ENCOUNTER — Encounter (HOSPITAL_COMMUNITY): Payer: Self-pay | Admitting: *Deleted

## 2017-05-21 ENCOUNTER — Other Ambulatory Visit: Payer: Self-pay

## 2017-05-21 DIAGNOSIS — Z87891 Personal history of nicotine dependence: Secondary | ICD-10-CM | POA: Insufficient documentation

## 2017-05-21 DIAGNOSIS — Z79899 Other long term (current) drug therapy: Secondary | ICD-10-CM | POA: Insufficient documentation

## 2017-05-21 DIAGNOSIS — R42 Dizziness and giddiness: Secondary | ICD-10-CM | POA: Insufficient documentation

## 2017-05-21 DIAGNOSIS — J45909 Unspecified asthma, uncomplicated: Secondary | ICD-10-CM | POA: Insufficient documentation

## 2017-05-21 LAB — BASIC METABOLIC PANEL
Anion gap: 6 (ref 5–15)
BUN: 13 mg/dL (ref 6–20)
CO2: 26 mmol/L (ref 22–32)
Calcium: 9.6 mg/dL (ref 8.9–10.3)
Chloride: 106 mmol/L (ref 101–111)
Creatinine, Ser: 0.55 mg/dL (ref 0.44–1.00)
GFR calc Af Amer: >60 mL/min (ref >60)
GFR calc non Af Amer: >60 mL/min (ref >60)
Glucose, Bld: 87 mg/dL (ref 65–99)
Potassium: 4.3 mmol/L (ref 3.5–5.1)
Sodium: 138 mmol/L (ref 135–145)

## 2017-05-21 LAB — URINALYSIS, ROUTINE W REFLEX MICROSCOPIC
Bacteria, UA: NONE SEEN
Bilirubin Urine: NEGATIVE
Glucose, UA: NEGATIVE mg/dL
Ketones, ur: NEGATIVE mg/dL
Leukocytes, UA: NEGATIVE
Nitrite: NEGATIVE
Protein, ur: NEGATIVE mg/dL
Specific Gravity, Urine: 1.017 (ref 1.005–1.030)
pH: 7 (ref 5.0–8.0)

## 2017-05-21 LAB — CBC WITH DIFFERENTIAL/PLATELET
Basophils Absolute: 0 10*3/uL (ref 0.0–0.1)
Basophils Relative: 1 %
Eosinophils Absolute: 0.4 10*3/uL (ref 0.0–0.7)
Eosinophils Relative: 6 %
HCT: 38.6 % (ref 36.0–46.0)
Hemoglobin: 12.7 g/dL (ref 12.0–15.0)
Lymphocytes Relative: 30 %
Lymphs Abs: 1.9 10*3/uL (ref 0.7–4.0)
MCH: 29.6 pg (ref 26.0–34.0)
MCHC: 32.9 g/dL (ref 30.0–36.0)
MCV: 90 fL (ref 78.0–100.0)
Monocytes Absolute: 0.4 10*3/uL (ref 0.1–1.0)
Monocytes Relative: 7 %
Neutro Abs: 3.7 10*3/uL (ref 1.7–7.7)
Neutrophils Relative %: 56 %
Platelets: 320 10*3/uL (ref 150–400)
RBC: 4.29 MIL/uL (ref 3.87–5.11)
RDW: 12.4 % (ref 11.5–15.5)
WBC: 6.4 10*3/uL (ref 4.0–10.5)

## 2017-05-21 LAB — PREGNANCY, URINE: Preg Test, Ur: NEGATIVE

## 2017-05-21 MED ORDER — SODIUM CHLORIDE 0.9 % IV BOLUS (SEPSIS)
1000.0000 mL | Freq: Once | INTRAVENOUS | Status: AC
  Administered 2017-05-21: 1000 mL via INTRAVENOUS

## 2017-05-21 MED ORDER — MECLIZINE HCL 25 MG PO TABS: 25.0000 mg | ORAL_TABLET | Freq: Once | ORAL | Status: DC

## 2017-05-21 NOTE — ED Provider Notes (Signed)
[facility]-EMERGENCY DEPT Provider Note   CSN: [id number] Arrival date & time: 05/21/17  0944     History   Chief Complaint Chief Complaint  Patient presents with  . Dizziness    HPI ADAIRA CENTOLA is a 22 y.o. female.  HPI Patient presents to the emergency department with episodes of vertigo over the last month.  The patient states he coming go.  The patient states nothing seems to make the condition better or worse.  She states sitting does seem to make the dizziness somewhat less.  The patient states that she did not take any medications prior to arrival.  She states that she has had some episodes of general weakness.  The patient denies chest pain, shortness of breath, headache,blurred vision, neck pain, fever, cough, weakness, numbness,  anorexia, edema, abdominal pain, nausea, vomiting, diarrhea, rash, back pain, dysuria, hematemesis, bloody stool, near syncope, or syncope. Past Medical History:  Diagnosis Date  . ADHD (attention deficit hyperactivity disorder)   . Asthma   . Headache    Migrainse with pregnancty  . SVD (spontaneous vaginal delivery) 03/17/2016    Patient Active Problem List   Diagnosis Date Noted  . Normal labor and delivery 03/17/2016  . SVD (spontaneous vaginal delivery) 03/17/2016  . Premature rupture of membranes 07/10/2013    Past Surgical History:  Procedure Laterality Date  . ADENOIDECTOMY    . TONSILLECTOMY      OB History    Gravida Para Term Preterm AB Living   2 2 2     2    SAB TAB Ectopic Multiple Live Births         0 2       Home Medications    Prior to Admission medications   Medication Sig Start Date End Date Taking? Authorizing Provider  acetaminophen (TYLENOL) 325 MG tablet Take 650 mg by mouth every 4 (four) hours as needed for mild pain, moderate pain, fever or headache.    Yes [provider]  etonogestrel (NEXPLANON) 68 MG IMPL implant 1 each by Subdermal route once. Placed 05/2016    Yes [provider]  ferrous sulfate 325 (65 FE) MG tablet Take 325 mg by mouth daily with breakfast.   Yes [provider]    Family History Family History  Problem Relation Age of Onset  . Hypertension Mother     Social History Social History   Tobacco Use  . Smoking status: Former Games developer  . Smokeless tobacco: Never Used  Substance Use Topics  . Alcohol use: No  . Drug use: No     Allergies   Patient has no known allergies.   Review of Systems Review of Systems All other systems negative except as documented in the HPI. All pertinent positives and negatives as reviewed in the HPI.  Physical Exam Updated Vital Signs BP 115/79 (BP Location: Right Arm)   Pulse 63   Temp 97.8 F (36.6 C) (Oral)   Resp 14   Ht 5\' 2"  (1.575 m)   Wt 59 kg (130 lb)   SpO2 100%   BMI 23.78 kg/m   Physical Exam  Constitutional: She is oriented to person, place, and time. She appears well-developed and well-nourished. No distress.  HENT:  Head: Normocephalic and atraumatic.  Mouth/Throat: Oropharynx is clear and moist.  Eyes: Pupils are equal, round, and reactive to light.  Neck: Normal range of motion. Neck supple.  Cardiovascular: Normal rate, regular rhythm and normal heart sounds. Exam reveals  no gallop and no friction rub.  No murmur heard. Pulmonary/Chest: Effort normal and breath sounds normal. No respiratory distress. She has no wheezes.  Abdominal: Soft. Bowel sounds are normal. She exhibits no distension and no mass. There is no tenderness. There is no guarding.  Neurological: She is alert and oriented to person, place, and time. She displays normal reflexes. No sensory deficit. She exhibits normal muscle tone. Coordination normal.  Skin: Skin is warm and dry. Capillary refill takes less than 2 seconds. No rash noted. No erythema.  Psychiatric: She has a normal mood and affect. Her behavior is normal.  Nursing note and vitals reviewed.    ED Treatments /  Results  Labs (all labs ordered are listed, but only abnormal results are displayed) Labs Reviewed  URINALYSIS, ROUTINE W REFLEX MICROSCOPIC - Abnormal; Notable for the following components:      Result Value   Hgb urine dipstick SMALL (*)    Squamous Epithelial / LPF 0-5 (*)    All other components within normal limits  BASIC METABOLIC PANEL  CBC WITH DIFFERENTIAL/PLATELET  PREGNANCY, URINE    EKG  EKG Interpretation None       Radiology No results found.  Procedures Procedures (including critical care time)  Medications Ordered in ED Medications  sodium chloride 0.9 % bolus 1,000 mL (0 mLs Intravenous Stopped 05/21/17 1225)     Initial Impression / Assessment and Plan / ED Course  I have reviewed the triage vital signs and the nursing notes.  Pertinent labs & imaging results that were available during my care of the patient were reviewed by me and considered in my medical decision making (see chart for details).    Feel the patient is suffering from peripheral vertigo.  She has no fine motor deficits noted on exam the patient will be referred to ENT for further evaluation.  She does not want to take any medicines due to the fact that she is breast-feeding.  Patient is advised to return here as needed.  She states that she does feel some better.  She is not orthostatic.  Her laboratory testing does not yield any significant abnormalities.  She was also given IV fluids.  Final Clinical Impressions(s) / ED Diagnoses   Final diagnoses:  Vertigo    ED Discharge Orders    None       Charlestine NightLawyer, Abeer Iversen, PA-C 05/21/17 1539    Raeford RazorKohut, Stephen, MD 05/21/17 1624

## 2017-05-21 NOTE — ED Notes (Signed)
ED Provider at bedside. 

## 2017-05-21 NOTE — Discharge Instructions (Signed)
Return here as needed.  Your testing today did not show any significant abnormality.  Increase your fluid intake.  Follow-up with the ENT doctor provided.

## 2017-05-21 NOTE — ED Triage Notes (Addendum)
Pt states for a while she has been having dizzy episodes, both when lying and moving. Nothing makes it better or worse. No other symptoms with dizziness. Pt later states she feels weak at times.

## 2018-05-22 ENCOUNTER — Other Ambulatory Visit: Payer: Self-pay | Admitting: Physician Assistant

## 2018-05-22 DIAGNOSIS — N644 Mastodynia: Secondary | ICD-10-CM

## 2018-06-05 ENCOUNTER — Other Ambulatory Visit: Payer: Self-pay

## 2018-06-06 ENCOUNTER — Emergency Department (HOSPITAL_COMMUNITY)
Admission: EM | Admit: 2018-06-06 | Discharge: 2018-06-07 | Disposition: A | Discharge status: Home / Self Care | Payer: Medicaid Other | Attending: Emergency Medicine | Admitting: Emergency Medicine

## 2018-06-06 ENCOUNTER — Emergency Department (HOSPITAL_COMMUNITY): Payer: Medicaid Other

## 2018-06-06 ENCOUNTER — Encounter (HOSPITAL_COMMUNITY): Payer: Self-pay | Admitting: Emergency Medicine

## 2018-06-06 ENCOUNTER — Other Ambulatory Visit: Payer: Self-pay

## 2018-06-06 DIAGNOSIS — F909 Attention-deficit hyperactivity disorder, unspecified type: Secondary | ICD-10-CM | POA: Insufficient documentation

## 2018-06-06 DIAGNOSIS — Z79899 Other long term (current) drug therapy: Secondary | ICD-10-CM | POA: Diagnosis not present

## 2018-06-06 DIAGNOSIS — R0602 Shortness of breath: Secondary | ICD-10-CM | POA: Diagnosis present

## 2018-06-06 DIAGNOSIS — Z87891 Personal history of nicotine dependence: Secondary | ICD-10-CM | POA: Insufficient documentation

## 2018-06-06 DIAGNOSIS — J45909 Unspecified asthma, uncomplicated: Secondary | ICD-10-CM | POA: Insufficient documentation

## 2018-06-06 DIAGNOSIS — J181 Lobar pneumonia, unspecified organism: Secondary | ICD-10-CM | POA: Insufficient documentation

## 2018-06-06 DIAGNOSIS — J189 Pneumonia, unspecified organism: Secondary | ICD-10-CM

## 2018-06-06 LAB — COMPREHENSIVE METABOLIC PANEL
ALK PHOS: 58 U/L (ref 38–126)
ALT: 13 U/L (ref 0–44)
AST: 21 U/L (ref 15–41)
Albumin: 4.5 g/dL (ref 3.5–5.0)
Anion gap: 11 (ref 5–15)
BILIRUBIN TOTAL: 0.8 mg/dL (ref 0.3–1.2)
BUN: 5 mg/dL — AB (ref 6–20)
CO2: 22 mmol/L (ref 22–32)
CREATININE: 0.63 mg/dL (ref 0.44–1.00)
Calcium: 9.7 mg/dL (ref 8.9–10.3)
Chloride: 104 mmol/L (ref 98–111)
GFR calc Af Amer: >60 mL/min (ref >60)
GLUCOSE: 92 mg/dL (ref 70–99)
Potassium: 3.7 mmol/L (ref 3.5–5.1)
Sodium: 137 mmol/L (ref 135–145)
TOTAL PROTEIN: 7.5 g/dL (ref 6.5–8.1)

## 2018-06-06 LAB — CBC
HCT: 36.5 % (ref 36.0–46.0)
Hemoglobin: 12 g/dL (ref 12.0–15.0)
MCH: 29.9 pg (ref 26.0–34.0)
MCHC: 32.9 g/dL (ref 30.0–36.0)
MCV: 91 fL (ref 80.0–100.0)
Platelets: 336 10*3/uL (ref 150–400)
RBC: 4.01 MIL/uL (ref 3.87–5.11)
RDW: 11.7 % (ref 11.5–15.5)
WBC: 6.2 10*3/uL (ref 4.0–10.5)
nRBC: 0 % (ref 0.0–0.2)

## 2018-06-06 LAB — I-STAT BETA HCG BLOOD, ED (MC, WL, AP ONLY)

## 2018-06-06 LAB — LIPASE, BLOOD: Lipase: 24 U/L (ref 11–51)

## 2018-06-06 MED ORDER — SODIUM CHLORIDE 0.9% FLUSH: 3.0000 mL | Freq: Once | INTRAVENOUS | Status: DC

## 2018-06-06 NOTE — ED Triage Notes (Addendum)
C/o L flank pain and L sided abd pain since last Thursday.  States she is coughing up blood and talked with PCP and advised to come to ED or be seen tomorrow for possible blood clot.  Reports tingling across chest that started 2 hours ago and radiates to back.  Also c/o nausea and SOB.

## 2018-06-07 ENCOUNTER — Other Ambulatory Visit: Payer: Self-pay

## 2018-06-07 LAB — URINALYSIS, ROUTINE W REFLEX MICROSCOPIC
BILIRUBIN URINE: NEGATIVE
Glucose, UA: NEGATIVE mg/dL
KETONES UR: 80 mg/dL — AB
LEUKOCYTES UA: NEGATIVE
Nitrite: NEGATIVE
PH: 6 (ref 5.0–8.0)
PROTEIN: NEGATIVE mg/dL
Specific Gravity, Urine: 1.021 (ref 1.005–1.030)

## 2018-06-07 LAB — D-DIMER, QUANTITATIVE: D-Dimer, Quant: 0.4 ug/mL-FEU (ref 0.00–0.50)

## 2018-06-07 MED ORDER — AMOXICILLIN-POT CLAVULANATE 875-125 MG PO TABS: 1.0000 | ORAL_TABLET | Freq: Two times a day (BID) | ORAL | 0 refills | Status: AC

## 2018-06-07 MED ORDER — IPRATROPIUM BROMIDE 0.02 % IN SOLN
0.5000 mg | Freq: Once | RESPIRATORY_TRACT | Status: AC
  Administered 2018-06-07: 0.5 mg via RESPIRATORY_TRACT
  Filled 2018-06-07: qty 2.5

## 2018-06-07 MED ORDER — ALBUTEROL SULFATE (2.5 MG/3ML) 0.083% IN NEBU: 2.5000 mg | INHALATION_SOLUTION | Freq: Four times a day (QID) | RESPIRATORY_TRACT | 12 refills | Status: DC | PRN

## 2018-06-07 MED ORDER — DOXYCYCLINE HYCLATE 100 MG PO CAPS: 100.0000 mg | ORAL_CAPSULE | Freq: Two times a day (BID) | ORAL | 0 refills | Status: AC

## 2018-06-07 MED ORDER — CALCIUM CARBONATE ANTACID 500 MG PO CHEW
1.0000 | CHEWABLE_TABLET | Freq: Once | ORAL | Status: AC
  Administered 2018-06-07: 200 mg via ORAL
  Filled 2018-06-07: qty 1

## 2018-06-07 MED ORDER — ALBUTEROL SULFATE (2.5 MG/3ML) 0.083% IN NEBU
5.0000 mg | INHALATION_SOLUTION | Freq: Once | RESPIRATORY_TRACT | Status: AC
  Administered 2018-06-07: 5 mg via RESPIRATORY_TRACT
  Filled 2018-06-07: qty 6

## 2018-06-07 NOTE — ED Notes (Signed)
Pt jogged to bathroom upon arrival to room. No distress noted.

## 2018-06-07 NOTE — Discharge Instructions (Signed)
Thank you for allowing me to care for you today in the Emergency Department.   I would continue to not take phentermine until you follow-up with your primary care provider.  Your physical exam today was consistent with pneumonia in your left lower lung.  To treat this, take 1 tablet of Augmentin and 1 tablet of doxycycline 2 times daily for the next 5 days.  Please call and schedule a recheck with your primary care provider for your symptoms in 2 to 3 days.  Call their office in the morning regarding your CT scan that you are scheduled for tomorrow.  You can use your home albuterol inhaler every 4 hours as needed for shortness of breath.  Take Tylenol or ibuprofen for fever or chills.  Return to the emergency department if you develop respiratory distress, significantly worsening shortness of breath, chest pain, new numbness or weakness, if you pass out, or other new, concerning symptoms.

## 2018-06-07 NOTE — ED Provider Notes (Signed)
[facility]   CSN: [id number] Arrival date & time: 06/06/18  2142     History   Chief Complaint Chief Complaint  Patient presents with  . Shortness of Breath  . Chest Pain  . Abdominal Pain    HPI Danielle Goodman is a 23 y.o. female the history of ADHD, anxiety, and asthma who presents to the emergency department with a chief complaint of chest pain.  The patient reports she has been having left flank pain for the last week.  The nonradiating pain has been intermittent.  She is unable to characterize the pain.  She states the pain is worse after sitting for long periods of time.  No other known aggravating or alleviating factors.  She reports that she coughed up a small amount of bright red blood 2 days ago, but has otherwise been having a productive cough for the last few days with shortness of breath that has been worsening since onset.    She reports she was seen by her PCP earlier this week who performed an x-ray of her left kidney, but was concerned about her pain because she heard some "fogginess" in her left lower lung and was concerned she may have a PE.  The patient reports that after the episode of small hemoptysis that she call back to her PCPs office and she is scheduled for a CT scan tomorrow, but was told to come to the ER if symptoms worsen. She reports earlier tonight she chest pain, numbness, and tingling across her bilateral anterior chest that started 2 hours prior to arrival and radiated to her left back.  No history of similar.  Pain is not pleuritic or exertional.  Of note, the patient also reports that she was started on phentermine 1 week ago.  Her last dose was yesterday.  She denies fever, chills, nasal congestion, headache, sore throat, palpitations, or leg swelling.  She reports she also developed some lower abdominal pain and bloating earlier tonight.  No nausea, vomiting, diarrhea, or urinary symptoms.   She is currently on her menstrual cycle.  She has a Nexplanon in place.  She is a current, every day 1 ppd smoker.  No recent surgery or immobilization.  No recent long travel.  No family or personal history of PE or DVT.  The history is provided by the patient. No language interpreter was used.  Chest Pain  Associated symptoms: abdominal pain, cough and shortness of breath   Associated symptoms: no back pain, no dizziness, no fever, no headache, no nausea, no numbness, no palpitations, no vomiting and no weakness   Abdominal Pain  Associated symptoms: cough and shortness of breath   Associated symptoms: no chest pain, no chills, no diarrhea, no dysuria, no fever, no nausea, no sore throat, no vaginal bleeding and no vomiting     Past Medical History:  Diagnosis Date  . ADHD (attention deficit hyperactivity disorder)   . Asthma   . Headache    Migrainse with pregnancty  . SVD (spontaneous vaginal delivery) 03/17/2016    Patient Active Problem List   Diagnosis Date Noted  . Normal labor and delivery 03/17/2016  . SVD (spontaneous vaginal delivery) 03/17/2016  . Premature rupture of membranes 07/10/2013    Past Surgical History:  Procedure Laterality Date  . ADENOIDECTOMY    . TONSILLECTOMY       OB History    Gravida  2   Para  2   Term  2   Preterm      AB      Living  2     SAB      TAB      Ectopic      Multiple  0   Live Births  2            Home Medications    Prior to Admission medications   Medication Sig Start Date End Date Taking? Authorizing Provider  acetaminophen (TYLENOL) 325 MG tablet Take 650 mg by mouth every 4 (four) hours as needed for mild pain, moderate pain, fever or headache.     [provider]  albuterol (PROVENTIL) (2.5 MG/3ML) 0.083% nebulizer solution Take 3 mLs (2.5 mg total) by nebulization every 6 (six) hours as needed for wheezing or shortness of breath. 06/07/18   Trayce Maino A, PA-C    amoxicillin-clavulanate (AUGMENTIN) 875-125 MG tablet Take 1 tablet by mouth every 12 (twelve) hours for 5 days. 06/07/18 06/12/18  Jadin Kagel A, PA-C  doxycycline (VIBRAMYCIN) 100 MG capsule Take 1 capsule (100 mg total) by mouth 2 (two) times daily for 5 days. 06/07/18 06/12/18  Fabyan Loughmiller A, PA-C  etonogestrel (NEXPLANON) 68 MG IMPL implant 1 each by Subdermal route once. Placed 05/2016    [provider]  ferrous sulfate 325 (65 FE) MG tablet Take 325 mg by mouth daily with breakfast.    [provider]    Family History Family History  Problem Relation Age of Onset  . Hypertension Mother     Social History Social History   Tobacco Use  . Smoking status: Former Games developer  . Smokeless tobacco: Never Used  Substance Use Topics  . Alcohol use: No  . Drug use: No     Allergies   Patient has no known allergies.   Review of Systems Review of Systems  Constitutional: Negative for activity change, chills and fever.  HENT: Negative for congestion, rhinorrhea and sore throat.   Respiratory: Positive for cough and shortness of breath. Negative for chest tightness and wheezing.   Cardiovascular: Negative for chest pain, palpitations and leg swelling.  Gastrointestinal: Positive for abdominal pain. Negative for diarrhea, nausea and vomiting.  Genitourinary: Negative for dysuria, frequency, urgency, vaginal bleeding and vaginal pain.  Musculoskeletal: Negative for back pain.  Skin: Negative for rash.  Allergic/Immunologic: Negative for immunocompromised state.  Neurological: Negative for dizziness, syncope, weakness, numbness and headaches.  Psychiatric/Behavioral: Negative for confusion.     Physical Exam Updated Vital Signs BP 112/60 (BP Location: Right Arm)   Pulse 84   Temp 98 F (36.7 C) (Oral)   Resp (!) 22   Ht 5\' 2"  (1.575 m)   Wt 62.6 kg   LMP 05/30/2018   SpO2 100%   BMI 25.24 kg/m   Physical Exam Vitals signs and nursing note reviewed.   Constitutional:      General: She is not in acute distress. HENT:     Head: Normocephalic.  Eyes:     Conjunctiva/sclera: Conjunctivae normal.  Neck:     Musculoskeletal: Neck supple.  Cardiovascular:     Rate and Rhythm: Normal rate and regular rhythm.     Heart sounds: No murmur. No friction rub. No gallop.   Pulmonary:     Effort: Pulmonary effort is normal. No respiratory distress.     Comments: Crackles in the left lung base.  Lungs are otherwise clear to auscultation bilaterally.  Good effort with inspiration. Chest:     Chest  wall: No deformity, tenderness or edema. There is no dullness to percussion.  Abdominal:     General: Bowel sounds are normal. There is no distension.     Palpations: Abdomen is soft.     Comments: Abdomen is soft, nondistended.  Minimal discomfort with palpation of the bilateral lower abdomen.  No rebound or guarding.  No tenderness over McBurney's point.  No CVA tenderness bilaterally.  Musculoskeletal:     Right lower leg: She exhibits no tenderness. No edema.     Left lower leg: She exhibits no tenderness. No edema.  Skin:    General: Skin is warm.     Capillary Refill: Capillary refill takes less than 2 seconds.     Findings: No rash.  Neurological:     Mental Status: She is alert.  Psychiatric:        Behavior: Behavior normal.    ED Treatments / Results  Labs (all labs ordered are listed, but only abnormal results are displayed) Labs Reviewed  COMPREHENSIVE METABOLIC PANEL - Abnormal; Notable for the following components:      Result Value   BUN 5 (*)    All other components within normal limits  URINALYSIS, ROUTINE W REFLEX MICROSCOPIC - Abnormal; Notable for the following components:   APPearance HAZY (*)    Hgb urine dipstick LARGE (*)    Ketones, ur 80 (*)    RBC / HPF >50 (*)    Bacteria, UA RARE (*)    All other components within normal limits  LIPASE, BLOOD  CBC  D-DIMER, QUANTITATIVE (NOT AT [facility])  I-STAT BETA HCG BLOOD,  ED ([facility], [facility], [facility] ONLY)    EKG EKG Interpretation  Date/Time:  Thursday June 07 2018 04:34:15 EST Ventricular Rate:  78 PR Interval:  192 QRS Duration: 85 QT Interval:  369 QTC Calculation: 421 R Axis:   66 Text Interpretation:  Sinus rhythm No significant change since last tracing Confirmed by Zadie Rhine ([id number]) on 06/07/2018 4:37:14 AM   Radiology Dg Chest 2 View  Result Date: 06/06/2018 CLINICAL DATA:  Flank pain and abdominal pain.  Chest tingling. EXAM: CHEST - 2 VIEW COMPARISON:  04/07/2015 FINDINGS: The heart size and mediastinal contours are within normal limits. Both lungs are clear. The visualized skeletal structures are unremarkable. IMPRESSION: Clear lungs. Electronically Signed   By: Deatra Robinson M.D.   On: 06/06/2018 23:05    Procedures Procedures (including critical care time)  Medications Ordered in ED Medications  albuterol (PROVENTIL) (2.5 MG/3ML) 0.083% nebulizer solution 5 mg (5 mg Nebulization Given 06/07/18 0345)  ipratropium (ATROVENT) nebulizer solution 0.5 mg (0.5 mg Nebulization Given 06/07/18 0345)  calcium carbonate (TUMS - dosed in mg elemental calcium) chewable tablet 200 mg of elemental calcium (200 mg of elemental calcium Oral Given 06/07/18 0307)     Initial Impression / Assessment and Plan / ED Course  I have reviewed the triage vital signs and the nursing notes.  Pertinent labs & imaging results that were available during my care of the patient were reviewed by me and considered in my medical decision making (see chart for details).     23 year old female with a history of ADHD, asthma, and anxiety presenting with left flank pain for the last week with shortness of breath and cough that have worsened over the last few days.  She had one episode of small-volume hemoptysis 2 days ago that is since resolved.  Tonight, she had an episode of chest pain and tingling across her bilateral  anterior chest that is since resolved.  She is a current,  every day smoker.  She was started on phentermine a week ago for weight loss.  When she was seen by her PCP earlier this week, there was concern that she may have a PE since she has a Nexplanon in place.  Vital signs are stable and reassuring.  She has no tachycardia.  Unfortunately, she is not PERC negative due to hormone use and one episode of hemoptysis.  Chest pain is not pleuritic and I have a lower clinical suspicion for PE, but the patient is requesting testing so a d-dimer has been ordered.  D-dimer is negative.  Based on clinical exam, we will treat the patient for left lower lobe community-acquired pneumonia.  She also feels much better after an albuterol nebulizer treatment.  She is requesting a prescription for a refill of home albuterol nebulizer vials, which I have provided.  Low suspicion for ACS, or PE.  I suspect some of her symptoms may be secondary to initiation of phentermine.  At this time, she is hemodynamically stable and in no acute distress.  Strict return precautions given.  She is safe for discharge home with outpatient follow-up at this time.  Final Clinical Impressions(s) / ED Diagnoses   Final diagnoses:  Community acquired pneumonia of left lower lobe of lung [facility](HCC)    ED Discharge Orders         Ordered    amoxicillin-clavulanate (AUGMENTIN) 875-125 MG tablet  Every 12 hours     06/07/18 0415    doxycycline (VIBRAMYCIN) 100 MG capsule  2 times daily     06/07/18 0415    albuterol (PROVENTIL) (2.5 MG/3ML) 0.083% nebulizer solution  Every 6 hours PRN     06/07/18 0449           Frederik PearMcDonald, Shaley Leavens A, PA-C 06/07/18 [id number]    Zadie RhineWickline, Donald, MD 06/08/18 [phone]

## 2018-06-08 ENCOUNTER — Encounter (HOSPITAL_COMMUNITY): Payer: Self-pay

## 2018-06-08 ENCOUNTER — Other Ambulatory Visit: Payer: Self-pay

## 2018-06-08 ENCOUNTER — Emergency Department (HOSPITAL_COMMUNITY): Payer: Medicaid Other

## 2018-06-08 ENCOUNTER — Emergency Department (HOSPITAL_COMMUNITY)
Admission: EM | Admit: 2018-06-08 | Discharge: 2018-06-08 | Disposition: A | Discharge status: Home / Self Care | Payer: Medicaid Other | Attending: Emergency Medicine | Admitting: Emergency Medicine

## 2018-06-08 DIAGNOSIS — R0789 Other chest pain: Secondary | ICD-10-CM | POA: Insufficient documentation

## 2018-06-08 DIAGNOSIS — Z87891 Personal history of nicotine dependence: Secondary | ICD-10-CM | POA: Diagnosis not present

## 2018-06-08 DIAGNOSIS — R109 Unspecified abdominal pain: Secondary | ICD-10-CM | POA: Insufficient documentation

## 2018-06-08 DIAGNOSIS — R05 Cough: Secondary | ICD-10-CM | POA: Insufficient documentation

## 2018-06-08 DIAGNOSIS — J45909 Unspecified asthma, uncomplicated: Secondary | ICD-10-CM | POA: Diagnosis not present

## 2018-06-08 DIAGNOSIS — Z79899 Other long term (current) drug therapy: Secondary | ICD-10-CM | POA: Insufficient documentation

## 2018-06-08 DIAGNOSIS — R0602 Shortness of breath: Secondary | ICD-10-CM | POA: Diagnosis not present

## 2018-06-08 DIAGNOSIS — R059 Cough, unspecified: Secondary | ICD-10-CM

## 2018-06-08 MED ORDER — IOPAMIDOL (ISOVUE-370) INJECTION 76%
INTRAVENOUS | Status: AC
  Filled 2018-06-08: qty 100

## 2018-06-08 MED ORDER — IOPAMIDOL (ISOVUE-370) INJECTION 76%
100.0000 mL | Freq: Once | INTRAVENOUS | Status: AC | PRN
  Administered 2018-06-08: 100 mL via INTRAVENOUS

## 2018-06-08 MED ORDER — SODIUM CHLORIDE (PF) 0.9 % IJ SOLN
INTRAMUSCULAR | Status: AC
  Filled 2018-06-08: qty 50

## 2018-06-08 NOTE — ED Provider Notes (Signed)
[facility]-EMERGENCY DEPT Provider Note   CSN: [id number] Arrival date & time: 06/08/18  1914     History   Chief Complaint Chief Complaint  Patient presents with  . Dyspnea on exertion    HPI Danielle Goodman is a 23 y.o. female presenting for evaluation of left back/flank pain, shortness of breath, cough.   Patient states a week and a half ago she developed left sided flank/mid back pain.  Pain was constant, worse with movement.  3 days ago, she developed worsening cough and shortness of breath.  She was seen in the ER the night before last, where she was found to have crackles on exam, but otherwise work-up was reassuring.  She was afebrile not tachycardic.  Her chest x-ray was without pneumonia or acute concerning findings.  Her d-dimer was negative at that time, so no CT scan was ordered.  Patient was discharged on antibiotics for presumed pneumonia and given a refill of her albuterol nebulizer solution.  Patient states she took 1 dose of her antibiotics, but has forgotten to take the others.  She has not taken anything for pain including Tylenol or ibuprofen.  When she uses her albuterol nebulizer, she reports improvement of her symptoms.  Patient states she talked her primary care doctor today, who told her to go back to the ER for a CT scan to rule out PE. Patient denies recent travel, surgeries, immobilization, history of cancer or personal history of DVT/PE.  Patient reports her grandmother had multiple DVTs, unknown if they were provoked.  Patient has a Nexplanon, does not take oral OCPs. Pt states today her chest pain is worse when she takes a deep breath in. She denies leg pain or swelling. She denies h/o kidney stones. L back/flank pain worsens with positioning and is intermittent.  PT was started on phentermine last week , feels this may be contributing to her sxs. Stopped taking it yesterday morning.   Pt with PMH of asthma and adhd, takes only  albuterol daily.    HPI  Past Medical History:  Diagnosis Date  . ADHD (attention deficit hyperactivity disorder)   . Asthma   . Headache    Migrainse with pregnancty  . SVD (spontaneous vaginal delivery) 03/17/2016    Patient Active Problem List   Diagnosis Date Noted  . Normal labor and delivery 03/17/2016  . SVD (spontaneous vaginal delivery) 03/17/2016  . Premature rupture of membranes 07/10/2013    Past Surgical History:  Procedure Laterality Date  . ADENOIDECTOMY    . TONSILLECTOMY       OB History    Gravida  2   Para  2   Term  2   Preterm      AB      Living  2     SAB      TAB      Ectopic      Multiple  0   Live Births  2            Home Medications    Prior to Admission medications   Medication Sig Start Date End Date Taking? Authorizing Provider  acetaminophen (TYLENOL) 325 MG tablet Take 650 mg by mouth every 4 (four) hours as needed for mild pain, moderate pain, fever or headache.    Yes [provider]  albuterol (PROVENTIL) (2.5 MG/3ML) 0.083% nebulizer solution Take 3 mLs (2.5 mg total) by nebulization every 6 (six) hours as needed for wheezing or shortness of  breath. 06/07/18  Yes McDonald, Mia A, PA-C  amoxicillin-clavulanate (AUGMENTIN) 875-125 MG tablet Take 1 tablet by mouth every 12 (twelve) hours for 5 days. 06/07/18 06/12/18 Yes McDonald, Mia A, PA-C  doxycycline (VIBRAMYCIN) 100 MG capsule Take 1 capsule (100 mg total) by mouth 2 (two) times daily for 5 days. 06/07/18 06/12/18 Yes McDonald, Mia A, PA-C  etonogestrel (NEXPLANON) 68 MG IMPL implant 1 each by Subdermal route once. Placed 05/2016   Yes [provider]  phentermine (ADIPEX-P) 37.5 MG tablet Take 37.5 mg by mouth daily before breakfast.    [provider]    Family History Family History  Problem Relation Age of Onset  . Hypertension Mother     Social History Social History   Tobacco Use  . Smoking status: Former Games developermoker  .  Smokeless tobacco: Never Used  Substance Use Topics  . Alcohol use: No  . Drug use: No     Allergies   Patient has no known allergies.   Review of Systems Review of Systems  Respiratory: Positive for cough, chest tightness and shortness of breath.   Cardiovascular: Positive for chest pain.  All other systems reviewed and are negative.    Physical Exam Updated Vital Signs BP 110/69 (BP Location: Left Arm)   Pulse 68   Temp 98.2 F (36.8 C) (Oral)   Resp 19   Ht 5\' 2"  (1.575 m)   Wt 62.6 kg   LMP 05/30/2018   SpO2 100%   BMI 25.24 kg/m   Physical Exam Vitals signs and nursing note reviewed.  Constitutional:      General: She is not in acute distress.    Appearance: She is well-developed.     Comments: Laying comfortably in the bed in NAD  HENT:     Head: Normocephalic and atraumatic.  Eyes:     Extraocular Movements: Extraocular movements intact.     Conjunctiva/sclera: Conjunctivae normal.     Pupils: Pupils are equal, round, and reactive to light.  Neck:     Musculoskeletal: Normal range of motion and neck supple.  Cardiovascular:     Rate and Rhythm: Normal rate and regular rhythm.     Pulses: Normal pulses.  Pulmonary:     Effort: Pulmonary effort is normal. No respiratory distress.     Breath sounds: Normal breath sounds. No wheezing.       Comments: Clear lung sounds. Speaking in full sentences. No ttp of anterior chest wall. TTP of L low ribs and L flank. Abdominal:     General: There is no distension.     Palpations: Abdomen is soft. There is no mass.     Tenderness: There is no abdominal tenderness. There is no guarding or rebound.     Comments: No ttp of the abd. Soft without rigidity, guarding, or distention.  Musculoskeletal: Normal range of motion.     Right lower leg: No edema.     Left lower leg: No edema.     Comments: No leg pain or swelling  Skin:    General: Skin is warm and dry.     Capillary Refill: Capillary refill takes less than  2 seconds.  Neurological:     Mental Status: She is alert and oriented to person, place, and time.      ED Treatments / Results  Labs (all labs ordered are listed, but only abnormal results are displayed) Labs Reviewed - No data to display  EKG None  Radiology Dg Chest 2 View  Result Date: 06/06/2018 CLINICAL DATA:  Flank pain and abdominal pain.  Chest tingling. EXAM: CHEST - 2 VIEW COMPARISON:  04/07/2015 FINDINGS: The heart size and mediastinal contours are within normal limits. Both lungs are clear. The visualized skeletal structures are unremarkable. IMPRESSION: Clear lungs. Electronically Signed   By: Deatra Robinson M.D.   On: 06/06/2018 23:05   Ct Angio Chest Pe W/cm &/or Wo Cm  Result Date: 06/08/2018 CLINICAL DATA:  Dyspnea on exertion. EXAM: CT ANGIOGRAPHY CHEST WITH CONTRAST TECHNIQUE: Multidetector CT imaging of the chest was performed using the standard protocol during bolus administration of intravenous contrast. Multiplanar CT image reconstructions and MIPs were obtained to evaluate the vascular anatomy. CONTRAST:  ISOVUE-370 IOPAMIDOL (ISOVUE-370) INJECTION 76% COMPARISON:  None. FINDINGS: Cardiovascular: Satisfactory opacification of the pulmonary arteries to the segmental level. No evidence of pulmonary embolism. Normal heart size. No pericardial effusion. Mediastinum/Nodes: No enlarged mediastinal, hilar, or axillary lymph nodes. Thyroid gland, trachea, and esophagus demonstrate no significant findings. Lungs/Pleura: Lungs are clear. No pleural effusion or pneumothorax. Upper Abdomen: No acute abnormality. Musculoskeletal: No chest wall abnormality. No acute or significant osseous findings. Review of the MIP images confirms the above findings. IMPRESSION: No pulmonary embolus. No acute abnormality identified in the chest. Electronically Signed   By: Sherian Rein M.D.   On: 06/08/2018 10:33   Ct Renal Stone Study  Result Date: 06/08/2018 CLINICAL DATA:  Left flank  pain. EXAM: CT ABDOMEN AND PELVIS WITHOUT CONTRAST TECHNIQUE: Multidetector CT imaging of the abdomen and pelvis was performed following the standard protocol without IV contrast. COMPARISON:  None. FINDINGS: Lower chest: No acute abnormality. Hepatobiliary: No focal liver abnormality is seen. Focal fatty infiltration of liver at the falciform ligament is identified. No gallstones, gallbladder wall thickening, or biliary dilatation. Pancreas: Unremarkable. No pancreatic ductal dilatation or surrounding inflammatory changes. Spleen: Normal in size without focal abnormality. Adrenals/Urinary Tract: Adrenal glands are unremarkable. Kidneys are normal, without renal calculi, focal lesion, or hydronephrosis. Bladder is unremarkable. Stomach/Bowel: Stomach is within normal limits. Appendix appears normal. No evidence of bowel wall thickening, distention, or inflammatory changes. Vascular/Lymphatic: No significant vascular findings are present. No enlarged abdominal or pelvic lymph nodes. Reproductive: The uterus is normal. There is a 3.6 x 4 1 cm cyst in the right ovary likely follicular cyst. Other: None. Musculoskeletal: No acute or significant osseous findings. IMPRESSION: No acute abnormality identified in the abdomen and pelvis. No nephrolithiasis or hydroureteronephrosis bilaterally. No bowel obstruction.  The appendix is normal. Electronically Signed   By: Sherian Rein M.D.   On: 06/08/2018 10:29    Procedures Procedures (including critical care time)  Medications Ordered in ED Medications  sodium chloride (PF) 0.9 % injection (has no administration in time range)  iopamidol (ISOVUE-370) 76 % injection (has no administration in time range)  iopamidol (ISOVUE-370) 76 % injection 100 mL (100 mLs Intravenous Contrast Given 06/08/18 0951)     Initial Impression / Assessment and Plan / ED Course  I have reviewed the triage vital signs and the nursing notes.  Pertinent labs & imaging results that were  available during my care of the patient were reviewed by me and considered in my medical decision making (see chart for details).     Patient presenting for evaluation of cough, shortness of breath, left mid back/flank pain, and increased pain with inspiration.  PCP apparently concerned about PE, despite reassuring vital signs and negative d-dimer 2 days ago.  Lab work from 2 days ago reviewed, no  concerning findings.  Reviewed x-ray from 2 days ago, no pneumonia.  Pain began in patient's flank, consider pain causing decreased inspiration and opportunity for pneumonia/atelectasis/viral illness.  Consider MSK cause as likely cause of pain.  Low suspicion for PE at this time with previous reassuring workup and pt without risk factors. However, as this is pt's 2nd visit for the same, will order CTA to r/o PE. Discussed risks and reasoning with pt, who still would like CTA. Consider kidney stone with location of flank pain, although sxs not completely typical. Pt is on her period, and thus has large blood in her UA. Discussed with pt, who woult like rental study, CT rental place. No lab work ordered, as it was obtained 2 days ago.   CT renal negative for stone or acute concerning intra-abdominal pathology.  CTA negative for PE.  Does not show pneumonia at this time.  Likely viral illness, however as patient has started antibiotics, will have her continue.  Discussed symptomatic treatment with Tylenol, ibuprofen, muscle creams.  Discussed follow-up with PCP if symptoms not improving.  Continue using inhaler/nebulizer as needed.  At this time, patient appears safe for discharge.  Return precautions given.  Patient states she understands and agrees to plan.   Final Clinical Impressions(s) / ED Diagnoses   Final diagnoses:  Cough  Atypical chest pain    ED Discharge Orders    None       Alveria ApleyCaccavale, Maeghan Canny, PA-C 06/08/18 1148    Alvira MondaySchlossman, Erin, MD 06/08/18 2222

## 2018-06-08 NOTE — Discharge Instructions (Addendum)
Continue taking antibiotics as prescribed. Use your inhaler as needed for as of breath or cough.  You may take it every 4-6 hours as needed. Continue to treat your pain symptomatically.  Use Tylenol and ibuprofen as needed for pain.  You may try muscle cream such as salon poss, icy hot, BenGay, Biofreeze to help with pain control. Your work-up today was negative for PE or current pneumonia.  There is no sign of kidney stone.  As such, your symptoms are likely due to muscular pain and a viral illness. Follow-up with your primary care doctor if your symptoms are not improving. Return to the emergency room with any new, worsening, concerning symptoms.

## 2018-06-08 NOTE — ED Notes (Signed)
Patient back from CT scan.

## 2018-06-08 NOTE — ED Triage Notes (Signed)
Patient presents with left flank pain and dyspnea on exertion. Patient reports she saw her PCP who referred her to the ER for a r/o PE CT scan, due to smoking and birth control. Patient reported to [facility] ED yesterday and had negative D-dimer and chest xray, then was discharged. Patient reports she called her PCP this morning and was told "to come back to a different ER and ask for a CT scan anyway." Patient denies CP/SOB at this time. Patient reports "My chest tingles, my cough gets worse, and my left lower back hurts when I exercise or get active."

## 2018-06-27 ENCOUNTER — Ambulatory Visit
Admission: RE | Admit: 2018-06-27 | Discharge: 2018-06-27 | Disposition: A | Discharge status: Home / Self Care | Payer: Medicaid Other | Source: Ambulatory Visit | Attending: Physician Assistant | Admitting: Physician Assistant

## 2018-06-27 DIAGNOSIS — N644 Mastodynia: Secondary | ICD-10-CM

## 2019-06-18 ENCOUNTER — Other Ambulatory Visit: Payer: Self-pay

## 2019-06-18 ENCOUNTER — Emergency Department (HOSPITAL_COMMUNITY)
Admission: EM | Admit: 2019-06-18 | Discharge: 2019-06-18 | Disposition: A | Discharge status: Home / Self Care | Payer: PRIVATE HEALTH INSURANCE | Attending: Emergency Medicine | Admitting: Emergency Medicine

## 2019-06-18 ENCOUNTER — Emergency Department (HOSPITAL_COMMUNITY): Payer: PRIVATE HEALTH INSURANCE

## 2019-06-18 ENCOUNTER — Encounter (HOSPITAL_COMMUNITY): Payer: Self-pay | Admitting: Emergency Medicine

## 2019-06-18 DIAGNOSIS — Z87891 Personal history of nicotine dependence: Secondary | ICD-10-CM | POA: Diagnosis not present

## 2019-06-18 DIAGNOSIS — Z79899 Other long term (current) drug therapy: Secondary | ICD-10-CM | POA: Diagnosis not present

## 2019-06-18 DIAGNOSIS — R519 Headache, unspecified: Secondary | ICD-10-CM | POA: Insufficient documentation

## 2019-06-18 DIAGNOSIS — J45909 Unspecified asthma, uncomplicated: Secondary | ICD-10-CM | POA: Insufficient documentation

## 2019-06-18 DIAGNOSIS — Z793 Long term (current) use of hormonal contraceptives: Secondary | ICD-10-CM | POA: Diagnosis not present

## 2019-06-18 MED ORDER — IOHEXOL 350 MG/ML SOLN
100.0000 mL | Freq: Once | INTRAVENOUS | Status: AC | PRN
  Administered 2019-06-18: 12:00:00 100 mL via INTRAVENOUS

## 2019-06-18 MED ORDER — SODIUM CHLORIDE (PF) 0.9 % IJ SOLN
INTRAMUSCULAR | Status: AC
  Filled 2019-06-18: qty 50

## 2019-06-18 MED ORDER — ACETAMINOPHEN 325 MG PO TABS
650.0000 mg | ORAL_TABLET | Freq: Once | ORAL | Status: AC
  Administered 2019-06-18: 10:00:00 650 mg via ORAL
  Filled 2019-06-18: qty 2

## 2019-06-18 NOTE — ED Notes (Signed)
Patient has returned from radiology and ambulated to the restroom and back to stretcher.

## 2019-06-18 NOTE — ED Triage Notes (Signed)
Patient states she has had a HA x1 month, states she saw ENT for it and they said it might be her heart. States that she gets a 'whooshing' in her R ear and it hurts on the top, posterior area of her skull. She was driving to work and had to pull over because her head hurt so bad. Denies any LOC, blurry vision or spots in her vision.

## 2019-06-18 NOTE — Discharge Instructions (Addendum)
To Mrs. Danielle Goodman,  Thank you for choosing [facility] for your healthcare maintenance. Today you were evaluated for your headache. Your head and neck imaging do not show signs of stenosis, stroke, or damage. Due to resolution of the headache with reassuring imaging, you will be discharged home from the hospital.

## 2019-06-18 NOTE — ED Provider Notes (Signed)
[facility]-EMERGENCY DEPT Provider Note   CSN: [id number] Arrival date & time: 06/18/19  0720     History Chief Complaint  Patient presents with  . Headache    Danielle Goodman is a 24 y.o. female.  Danielle Goodman is a 25 y/o female, with a PMH of ADHD, asthma and headache, who presents to [facility] ED with headache. Patient states that her symptoms began a month ago, when she started hearing her heartbeat in her head. During this time,  she stated that she was having headaches with occasional chest pain. She states these particular headache episodes are different from her typical migraines. There are no auras associated with them and they occur in the occipital region of her head. She did have a recent appointment with ENT who, "Did not hear the whooshing," in her right neck. She came to the ED this morning, because while she was driving to drop her son off at daycare before work, she stated that she had severe R sided head pain, and felt locked in place. She denied loss of consciousness during this event. She was able to safely pull over, drop her son off at daycare, and come to [facility] ED for evaluation. She denies lower extremity weakness, changes in vision, unintended urine/fecal loss, or loss of balance,        Past Medical History:  Diagnosis Date  . ADHD (attention deficit hyperactivity disorder)   . Asthma   . Headache    Migrainse with pregnancty  . SVD (spontaneous vaginal delivery) 03/17/2016    Patient Active Problem List   Diagnosis Date Noted  . Normal labor and delivery 03/17/2016  . SVD (spontaneous vaginal delivery) 03/17/2016  . Premature rupture of membranes 07/10/2013    Past Surgical History:  Procedure Laterality Date  . ADENOIDECTOMY    . TONSILLECTOMY       OB History    Gravida  2   Para  2   Term  2   Preterm      AB      Living  2     SAB      TAB      Ectopic      Multiple  0   Live Births  2            Family History  Problem Relation Age of Onset  . Hypertension Mother     Social History   Tobacco Use  . Smoking status: Former Games developer  . Smokeless tobacco: Never Used  Substance Use Topics  . Alcohol use: No  . Drug use: No    Home Medications Prior to Admission medications   Medication Sig Start Date End Date Taking? Authorizing Provider  acetaminophen (TYLENOL) 325 MG tablet Take 650 mg by mouth every 4 (four) hours as needed for mild pain, moderate pain, fever or headache.     [provider]  albuterol (PROVENTIL) (2.5 MG/3ML) 0.083% nebulizer solution Take 3 mLs (2.5 mg total) by nebulization every 6 (six) hours as needed for wheezing or shortness of breath. 06/07/18   McDonald, Mia A, PA-C  etonogestrel (NEXPLANON) 68 MG IMPL implant 1 each by Subdermal route once. Placed 05/2016    [provider]  phentermine (ADIPEX-P) 37.5 MG tablet Take 37.5 mg by mouth daily before breakfast.    [provider]    Allergies    Patient has no allergy information on record.  Review of Systems   Review of Systems  Constitutional: Negative for chills, fatigue and fever.  HENT: Negative for ear pain and hearing loss.   Eyes: Negative for photophobia, pain, redness and visual disturbance.  Respiratory: Negative for choking, chest tightness, shortness of breath, wheezing and stridor.   Cardiovascular: Positive for chest pain.       Endorses occasional chest pain.   Gastrointestinal: Negative for abdominal pain, constipation, diarrhea, nausea and vomiting.  Musculoskeletal: Negative for arthralgias and gait problem.  Neurological: Positive for dizziness and headaches. Negative for weakness, light-headedness and numbness.  Psychiatric/Behavioral: Negative for confusion.    Physical Exam Updated Vital Signs BP 136/83 (BP Location: Left Arm)   Pulse 83   Temp 98.6 F (37 C) (Oral)   Resp 18   SpO2 100%   Physical Exam Vitals reviewed.   Constitutional:      General: She is not in acute distress.    Appearance: She is well-developed. She is not ill-appearing, toxic-appearing or diaphoretic.  HENT:     Head: Normocephalic and atraumatic.  Eyes:     General: No visual field deficit or scleral icterus.    Extraocular Movements: Extraocular movements intact.     Right eye: Normal extraocular motion and no nystagmus.     Left eye: Normal extraocular motion and no nystagmus.     Pupils: Pupils are equal, round, and reactive to light. Pupils are equal.  Cardiovascular:     Rate and Rhythm: Normal rate and regular rhythm.     Heart sounds: Normal heart sounds. No murmur. No friction rub. No gallop.   Pulmonary:     Effort: Pulmonary effort is normal.     Breath sounds: Normal breath sounds. No wheezing, rhonchi or rales.  Chest:     Chest wall: No tenderness.  Abdominal:     General: Bowel sounds are normal. There is no distension.     Palpations: Abdomen is soft.     Tenderness: There is no abdominal tenderness.  Musculoskeletal:     Cervical back: No rigidity.  Skin:    General: Skin is warm and dry.     Coloration: Skin is not cyanotic.  Neurological:     Mental Status: She is alert and oriented to person, place, and time.     Cranial Nerves: No cranial nerve deficit or facial asymmetry.     Motor: No weakness.     Coordination: Romberg sign negative. Coordination normal.     Gait: Gait normal.  Psychiatric:        Mood and Affect: Mood normal.        Behavior: Behavior normal.     ED Results / Procedures / Treatments   Labs (all labs ordered are listed, but only abnormal results are displayed) Labs Reviewed - No data to display  EKG None  Radiology No results found.  Procedures Procedures (including critical care time)  Medications Ordered in ED Medications - No data to display  ED Course  I have reviewed the triage vital signs and the nursing notes.  Pertinent labs & imaging results that  were available during my care of the patient were reviewed by me and considered in my medical decision making (see chart for details).     Final Clinical Impression(s) / ED Diagnoses Final diagnoses:  None  Patient is a 24 y/o female with a PMH of headaches and ADHD, who presents with evolution of her headache. CTA head and neck were ordered as patient states that this was the most painful and acute headache  episode she has experienced. Her imaging did not show any acute abnormalities and on revaluation the patient's headache had resolved. Patient was discharged in stable condition.   Rx / DC Orders ED Discharge Orders    None       Maudie Mercury, MD 06/18/19 1505    Blanchie Dessert, MD 06/18/19 [phone]

## 2019-06-21 ENCOUNTER — Other Ambulatory Visit: Payer: Self-pay

## 2019-06-21 ENCOUNTER — Emergency Department (HOSPITAL_COMMUNITY): Payer: PRIVATE HEALTH INSURANCE

## 2019-06-21 ENCOUNTER — Emergency Department (HOSPITAL_COMMUNITY)
Admission: EM | Admit: 2019-06-21 | Discharge: 2019-06-21 | Disposition: A | Discharge status: Home / Self Care | Payer: PRIVATE HEALTH INSURANCE | Attending: Emergency Medicine | Admitting: Emergency Medicine

## 2019-06-21 ENCOUNTER — Encounter (HOSPITAL_COMMUNITY): Payer: Self-pay | Admitting: Emergency Medicine

## 2019-06-21 DIAGNOSIS — J45909 Unspecified asthma, uncomplicated: Secondary | ICD-10-CM | POA: Diagnosis not present

## 2019-06-21 DIAGNOSIS — R002 Palpitations: Secondary | ICD-10-CM | POA: Insufficient documentation

## 2019-06-21 DIAGNOSIS — R059 Cough, unspecified: Secondary | ICD-10-CM

## 2019-06-21 DIAGNOSIS — R0602 Shortness of breath: Secondary | ICD-10-CM | POA: Diagnosis present

## 2019-06-21 DIAGNOSIS — R05 Cough: Secondary | ICD-10-CM | POA: Insufficient documentation

## 2019-06-21 DIAGNOSIS — R079 Chest pain, unspecified: Secondary | ICD-10-CM | POA: Diagnosis not present

## 2019-06-21 LAB — BASIC METABOLIC PANEL
Anion gap: 8 (ref 5–15)
BUN: 13 mg/dL (ref 6–20)
CO2: 25 mmol/L (ref 22–32)
Calcium: 8.9 mg/dL (ref 8.9–10.3)
Chloride: 104 mmol/L (ref 98–111)
Creatinine, Ser: 0.5 mg/dL (ref 0.44–1.00)
GFR calc Af Amer: >60 mL/min (ref >60)
GFR calc non Af Amer: >60 mL/min (ref >60)
Glucose, Bld: 92 mg/dL (ref 70–99)
Potassium: 3.6 mmol/L (ref 3.5–5.1)
Sodium: 137 mmol/L (ref 135–145)

## 2019-06-21 LAB — CBC WITH DIFFERENTIAL/PLATELET
Abs Immature Granulocytes: 0.01 10*3/uL (ref 0.00–0.07)
Basophils Absolute: 0.1 10*3/uL (ref 0.0–0.1)
Basophils Relative: 1 %
Eosinophils Absolute: 0.2 10*3/uL (ref 0.0–0.5)
Eosinophils Relative: 2 %
HCT: 34.4 % — ABNORMAL LOW (ref 36.0–46.0)
Hemoglobin: 11.2 g/dL — ABNORMAL LOW (ref 12.0–15.0)
Immature Granulocytes: 0 %
Lymphocytes Relative: 27 %
Lymphs Abs: 2.2 10*3/uL (ref 0.7–4.0)
MCH: 29.9 pg (ref 26.0–34.0)
MCHC: 32.6 g/dL (ref 30.0–36.0)
MCV: 92 fL (ref 80.0–100.0)
Monocytes Absolute: 0.7 10*3/uL (ref 0.1–1.0)
Monocytes Relative: 8 %
Neutro Abs: 5.1 10*3/uL (ref 1.7–7.7)
Neutrophils Relative %: 62 %
Platelets: 377 10*3/uL (ref 150–400)
RBC: 3.74 MIL/uL — ABNORMAL LOW (ref 3.87–5.11)
RDW: 12.2 % (ref 11.5–15.5)
WBC: 8.3 10*3/uL (ref 4.0–10.5)
nRBC: 0 % (ref 0.0–0.2)

## 2019-06-21 LAB — D-DIMER, QUANTITATIVE: D-Dimer, Quant: 0.34 ug/mL-FEU (ref 0.00–0.50)

## 2019-06-21 LAB — I-STAT BETA HCG BLOOD, ED (MC, WL, AP ONLY): I-stat hCG, quantitative: <5 m[IU]/mL (ref <5)

## 2019-06-21 LAB — TROPONIN I (HIGH SENSITIVITY): Troponin I (High Sensitivity): <2 ng/L (ref <18)

## 2019-06-21 MED ORDER — ALBUTEROL SULFATE HFA 108 (90 BASE) MCG/ACT IN AERS: 2.0000 | INHALATION_SPRAY | RESPIRATORY_TRACT | 0 refills | Status: DC | PRN

## 2019-06-21 MED ORDER — BENZONATATE 100 MG PO CAPS: 100.0000 mg | ORAL_CAPSULE | Freq: Three times a day (TID) | ORAL | 0 refills | Status: DC

## 2019-06-21 NOTE — ED Triage Notes (Signed)
Per pt, states has been SOB for the past couple of days-states she had covid 1/1 but does not think this is related-states she is having right ear pain which she has been seen for by ENT-was here for same symptoms 2 days ago

## 2019-06-21 NOTE — ED Provider Notes (Signed)
[facility]-EMERGENCY DEPT Provider Note   CSN: [id number] Arrival date & time: 06/21/19  1652     History Chief Complaint  Patient presents with  . Shortness of Breath    Danielle Goodman is a 24 y.o. female with past medical history significant for migraines, pulsatile tinnitus, asthma, ADHD who presents for evaluation of shortness of breath.  Patient diagnosed with Covid on 1/1.  Has had intermittent pleuritic chest pain, shortness of breath.  States shortness of breath waxes and wanes however feels like it has more more constant the last 2 days.  Patient states she occasionally wake up in the middle the night like she cannot breathe.  No prior history of heart failure, lower extremity edema.  No oral OCPs, recent surgery, recent mobilization or malignancy.  No family history of personal history of PE or DVT.  Patient states she has been coughing up white sputum.  Patient states this morning she did cough up blood-tinged sputum.  No exertional chest pain, radiation to left arm, left jaw, radiation to back, associated lightheadedness, dizziness, nausea or vomiting.  No abdominal pain, fever, chills, neck pain, neck stiffness, lateral leg swelling, redness or warmth, rashes, lesions, dysuria constipation.  Denies additional aggravating or alleviating factors.  History obtained from patient and past medical records.  No interpreter is used.  Of note patient with history of migraines and pulsatile tinnitus.  Has been eval by ENT and according to patient cannot determine the cause.  She had CTA head and neck 2 days ago in the emergency department without any significant findings.  HPI     Past Medical History:  Diagnosis Date  . ADHD (attention deficit hyperactivity disorder)   . Asthma   . Headache    Migrainse with pregnancty  . SVD (spontaneous vaginal delivery) 03/17/2016    Patient Active Problem List   Diagnosis Date Noted  . Normal labor and delivery  03/17/2016  . SVD (spontaneous vaginal delivery) 03/17/2016  . Premature rupture of membranes 07/10/2013    Past Surgical History:  Procedure Laterality Date  . ADENOIDECTOMY    . TONSILLECTOMY       OB History    Gravida  2   Para  2   Term  2   Preterm      AB      Living  2     SAB      TAB      Ectopic      Multiple  0   Live Births  2           Family History  Problem Relation Age of Onset  . Hypertension Mother     Social History   Tobacco Use  . Smoking status: Former Games developer  . Smokeless tobacco: Never Used  Substance Use Topics  . Alcohol use: No  . Drug use: No    Home Medications Prior to Admission medications   Medication Sig Start Date End Date Taking? Authorizing Provider  acetaminophen (TYLENOL) 325 MG tablet Take 650 mg by mouth every 4 (four) hours as needed for headache.     [provider]  albuterol (VENTOLIN HFA) 108 (90 Base) MCG/ACT inhaler Inhale 2 puffs into the lungs every 4 (four) hours as needed for wheezing or shortness of breath. 06/21/19   Daquan Crapps A, PA-C  benzonatate (TESSALON) 100 MG capsule Take 1 capsule (100 mg total) by mouth every 8 (eight) hours. 06/21/19   Chennel Olivos A, PA-C  Allergies    Patient has no known allergies.  Review of Systems   Review of Systems  Constitutional: Negative.   HENT: Negative.   Respiratory: Positive for cough and shortness of breath. Negative for choking, chest tightness, wheezing and stridor.   Cardiovascular: Positive for chest pain and palpitations. Negative for leg swelling.  Gastrointestinal: Negative.   Genitourinary: Negative.   Musculoskeletal: Negative.   Skin: Negative.   Neurological: Negative.   All other systems reviewed and are negative.   Physical Exam Updated Vital Signs BP 129/77   Pulse 71   Temp 98.1 F (36.7 C) (Oral)   Resp (!) 22   LMP 06/12/2019 (Approximate)   SpO2 100%   Physical Exam Vitals and nursing note  reviewed.  Constitutional:      General: She is not in acute distress.    Appearance: She is not ill-appearing, toxic-appearing or diaphoretic.  HENT:     Head: Normocephalic and atraumatic.     Jaw: There is normal jaw occlusion.     Right Ear: Tympanic membrane, ear canal and external ear normal. There is no impacted cerumen. No hemotympanum. Tympanic membrane is not injected, scarred, perforated, erythematous, retracted or bulging.     Left Ear: Tympanic membrane, ear canal and external ear normal. There is no impacted cerumen. No hemotympanum. Tympanic membrane is not injected, scarred, perforated, erythematous, retracted or bulging.     Ears:     Comments: No Mastoid tenderness.    Nose:     Comments: Clear rhinorrhea and congestion to bilateral nares.  No sinus tenderness.    Mouth/Throat:     Comments: Posterior oropharynx clear.  Mucous membranes moist.  Tonsils without erythema or exudate.  Uvula midline without deviation.  No evidence of PTA or RPA.  No drooling, dysphasia or trismus.  Phonation normal. Neck:     Trachea: Trachea and phonation normal.     Meningeal: Brudzinski's sign and Kernig's sign absent.     Comments: No Neck stiffness or neck rigidity.  No meningismus.  No cervical lymphadenopathy. Cardiovascular:     Comments: No murmurs rubs or gallops. Pulmonary:     Comments: Clear to auscultation bilaterally without wheeze, rhonchi or rales.  No accessory muscle usage.  Able speak in full sentences. Abdominal:     Comments: Soft, nontender without rebound or guarding.  No CVA tenderness.  Musculoskeletal:     Comments: Moves all 4 extremities without difficulty.  Lower extremities without edema, erythema or warmth.  Skin:    Comments: Brisk capillary refill.  No rashes or lesions.  Neurological:     Mental Status: She is alert.     Comments: Ambulatory in department without difficulty.  Cranial nerves II through XII grossly intact.  No facial droop.  No aphasia.      ED Results / Procedures / Treatments   Labs (all labs ordered are listed, but only abnormal results are displayed) Labs Reviewed  CBC WITH DIFFERENTIAL/PLATELET - Abnormal; Notable for the following components:      Result Value   RBC 3.74 (*)    Hemoglobin 11.2 (*)    HCT 34.4 (*)    All other components within normal limits  BASIC METABOLIC PANEL  D-DIMER, QUANTITATIVE (NOT AT [facility])  I-STAT BETA HCG BLOOD, ED ([facility], [facility], [facility] ONLY)  TROPONIN I (HIGH SENSITIVITY)  TROPONIN I (HIGH SENSITIVITY)    EKG None  Radiology DG Chest Portable 1 View  Result Date: 06/21/2019 CLINICAL DATA:  Chest pain EXAM:  PORTABLE CHEST 1 VIEW COMPARISON:  June 06, 2018 FINDINGS: The heart size and mediastinal contours are within normal limits. Both lungs are clear. The visualized skeletal structures are unremarkable. IMPRESSION: No active disease. Electronically Signed   By: Sherian Rein M.D.   On: 06/21/2019 18:10    Procedures Procedures (including critical care time)  Medications Ordered in ED Medications - No data to display  ED Course  I have reviewed the triage vital signs and the nursing notes.  Pertinent labs & imaging results that were available during my care of the patient were reviewed by me and considered in my medical decision making (see chart for details).  88 old female appears otherwise well presents for evaluation of intermittent pleuritic chest pain and shortness of breath.  Afebrile, nonseptic, non-ill-appearing.  No evidence of DVT on exam, no evidence of fluid overload.  Heart and lungs clear.  She has no tachycardia, tachypnea or hypoxia.  No risk factors for PE or DVT.  No family history or personal history of ACS or PE.  Did have one episode of hemoptysis this morning.  Diagnosed with Covid 1 month ago.  Plan on labs, imaging, EKG and reevaluate.  Labs and imaging personally reviewed: CBC without leukocytosis, hemoglobin 11.2 Metabolic panel without electrolyte,  renal or liver normality I-STAT hCG negative D-dimer 0.34 Troponin negative Chest x-ray negative for infiltrates, cardiomegaly, pulmonary edema or pneumothorax.  Patient reassessed.  Ambulatory in room without any hypoxia, tachycardia or tachypnea.  She has no evidence of DVT on exam.  Have low suspicion for ACS, dissection, PE, myocarditis, pericarditis, pneumothorax, bacterial infectious process.  Question cough, shortness of breath due to her prior Covid diagnosis.  DC home with [employer].  She is also out of her albuterol inhaler at home.  She has no evidence of acute asthma exacerbation here in the ED and actually has clear lung sounds.  She is to follow-up with PCP for reevaluation.  The patient has been appropriately medically screened and/or stabilized in the ED. I have low suspicion for any other emergent medical condition which would require further screening, evaluation or treatment in the ED or require inpatient management.  Patient is hemodynamically stable and in no acute distress.  Patient able to ambulate in department prior to ED.  Evaluation does not show acute pathology that would require ongoing or additional emergent interventions while in the emergency department or further inpatient treatment.  I have discussed the diagnosis with the patient and answered all questions.  Pain is been managed while in the emergency department and patient has no further complaints prior to discharge.  Patient is comfortable with plan discussed in room and is stable for discharge at this time.  I have discussed strict return precautions for returning to the emergency department.  Patient was encouraged to follow-up with PCP/specialist refer to at discharge.    MDM Rules/Calculators/A&P                      Deepti Gunawan was evaluated in Emergency Department on 06/21/2019 for the symptoms described in the history of present illness. She was evaluated in the context of the global COVID-19  pandemic, which necessitated consideration that the patient might be at risk for infection with the SARS-CoV-2 virus that causes COVID-19. Institutional protocols and algorithms that pertain to the evaluation of patients at risk for COVID-19 are in a state of rapid change based on information released by regulatory bodies including the CDC and  federal and state organizations. These policies and algorithms were followed during the patient's care in the ED. Final Clinical Impression(s) / ED Diagnoses Final diagnoses:  SOB (shortness of breath)  Cough    Rx / DC Orders ED Discharge Orders         Ordered    albuterol (VENTOLIN HFA) 108 (90 Base) MCG/ACT inhaler  Every 4 hours PRN     06/21/19 1932    benzonatate (TESSALON) 100 MG capsule  Every 8 hours     06/21/19 1936           Constantin Hillery A, PA-C 06/21/19 1936    Ezequiel Essex, MD 06/21/19 2014    Ezequiel Essex, MD 06/21/19 2015

## 2019-06-21 NOTE — Discharge Instructions (Addendum)
You need to follow-up with your primary care provider for possible sleep apnea.  I have refilled your inhaler.  Return to the ED for any new or worsening symptoms

## 2019-06-24 ENCOUNTER — Encounter: Payer: Self-pay | Admitting: Neurology

## 2019-06-25 ENCOUNTER — Other Ambulatory Visit: Payer: Self-pay | Admitting: Otolaryngology

## 2019-08-14 NOTE — Progress Notes (Deleted)
NEUROLOGY CONSULTATION NOTE  Danielle Goodman MRN: [id number] DOB: 1996/05/01  Referring provider: Gwyneth Sprout, MD (ED referral) Primary care provider: No PCP  Reason for consult:  headache  HISTORY OF PRESENT ILLNESS: Danielle Goodman is a 24 year old ***-handed white female with ADHD and migraines who presents for headache.  History supplemented by ED note.  ***.  She also has been evaluated for pulsatile tinnitus, ***.  She was evaluated by ENT with no remarkable findings.  CTA of head and neck *** were unremarkable.  She presented to the ED on 06/18/2019 for new headache in which she developed a severe right sided headache ***.  CTA of head and neck personally reviewed was normal.    She previously had an MRI of brain without contrast performed on 06/20/2013 to evaluate a migraine with visual changes while she was pregnant, which showed no acute intracranial abnormality.  Current NSAIDS:  *** Current analgesics:  *** Current triptans:  *** Current ergotamine:  *** Current anti-emetic:  *** Current muscle relaxants:  *** Current anti-anxiolytic:  *** Current sleep aide:  *** Current Antihypertensive medications:  *** Current Antidepressant medications:  *** Current Anticonvulsant medications:  *** Current anti-CGRP:  *** Current Vitamins/Herbal/Supplements:  *** Current Antihistamines/Decongestants:  *** Other therapy:  *** Hormone/birth control:  *** Other medications:  ***  Past NSAIDS:  *** Past analgesics:  *** Past abortive triptans:  *** Past abortive ergotamine:  *** Past muscle relaxants:  *** Past anti-emetic:  *** Past antihypertensive medications:  *** Past antidepressant medications:  *** Past anticonvulsant medications:  *** Past anti-CGRP:  *** Past vitamins/Herbal/Supplements:  *** Past antihistamines/decongestants:  *** Other past therapies:  ***  Caffeine:  *** Alcohol:  *** Smoker:  *** Diet:  *** Exercise:  *** Depression:  ***;  Anxiety:  *** Other pain:  *** Sleep hygiene:  *** Family history of headache:  ***   PAST MEDICAL HISTORY: Past Medical History:  Diagnosis Date  . ADHD (attention deficit hyperactivity disorder)   . Asthma   . Headache    Migrainse with pregnancty  . SVD (spontaneous vaginal delivery) 03/17/2016    PAST SURGICAL HISTORY: Past Surgical History:  Procedure Laterality Date  . ADENOIDECTOMY    . TONSILLECTOMY      MEDICATIONS: Current Outpatient Medications on File Prior to Visit  Medication Sig Dispense Refill  . acetaminophen (TYLENOL) 325 MG tablet Take 650 mg by mouth every 4 (four) hours as needed for headache.     . albuterol (VENTOLIN HFA) 108 (90 Base) MCG/ACT inhaler Inhale 2 puffs into the lungs every 4 (four) hours as needed for wheezing or shortness of breath. 8 g 0  . benzonatate (TESSALON) 100 MG capsule Take 1 capsule (100 mg total) by mouth every 8 (eight) hours. 21 capsule 0   No current facility-administered medications on file prior to visit.    ALLERGIES: No Known Allergies  FAMILY HISTORY: Family History  Problem Relation Age of Onset  . Hypertension Mother    ***.  SOCIAL HISTORY: Social History   Socioeconomic History  . Marital status: Married    Spouse name: Not on file  . Number of children: Not on file  . Years of education: Not on file  . Highest education level: Not on file  Occupational History  . Not on file  Tobacco Use  . Smoking status: Former Games developer  . Smokeless tobacco: Never Used  Substance and Sexual Activity  . Alcohol use: No  . Drug  use: No  . Sexual activity: Yes    Birth control/protection: None, Implant  Other Topics Concern  . Not on file  Social History Narrative  . Not on file   Social Determinants of Health   Financial Resource Strain:   . Difficulty of Paying Living Expenses:   Food Insecurity:   . Worried About Charity fundraiser in the Last Year:   . Arboriculturist in the Last Year:     Transportation Needs:   . Film/video editor (Medical):   Marland Kitchen Lack of Transportation (Non-Medical):   Physical Activity:   . Days of Exercise per Week:   . Minutes of Exercise per Session:   Stress:   . Feeling of Stress :   Social Connections:   . Frequency of Communication with Friends and Family:   . Frequency of Social Gatherings with Friends and Family:   . Attends Religious Services:   . Active Member of Clubs or Organizations:   . Attends Archivist Meetings:   Marland Kitchen Marital Status:   Intimate Partner Violence:   . Fear of Current or Ex-Partner:   . Emotionally Abused:   Marland Kitchen Physically Abused:   . Sexually Abused:     REVIEW OF SYSTEMS: Constitutional: No fevers, chills, or sweats, no generalized fatigue, change in appetite Eyes: No visual changes, double vision, eye pain Ear, nose and throat: No hearing loss, ear pain, nasal congestion, sore throat Cardiovascular: No chest pain, palpitations Respiratory:  No shortness of breath at rest or with exertion, wheezes GastrointestinaI: No nausea, vomiting, diarrhea, abdominal pain, fecal incontinence Genitourinary:  No dysuria, urinary retention or frequency Musculoskeletal:  No neck pain, back pain Integumentary: No rash, pruritus, skin lesions Neurological: as above Psychiatric: No depression, insomnia, anxiety Endocrine: No palpitations, fatigue, diaphoresis, mood swings, change in appetite, change in weight, increased thirst Hematologic/Lymphatic:  No purpura, petechiae. Allergic/Immunologic: no itchy/runny eyes, nasal congestion, recent allergic reactions, rashes  PHYSICAL EXAM: *** General: No acute distress.  Patient appears ***-groomed.  *** Head:  Normocephalic/atraumatic Eyes:  fundi examined but not visualized Neck: supple, no paraspinal tenderness, full range of motion Back: No paraspinal tenderness Heart: regular rate and rhythm Lungs: Clear to auscultation bilaterally. Vascular: No carotid  bruits. Neurological Exam: Mental status: alert and oriented to person, place, and time, recent and remote memory intact, fund of knowledge intact, attention and concentration intact, speech fluent and not dysarthric, language intact. Cranial nerves: CN I: not tested CN II: pupils equal, round and reactive to light, visual fields intact CN III, IV, VI:  full range of motion, no nystagmus, no ptosis CN V: facial sensation intact CN VII: upper and lower face symmetric CN VIII: hearing intact CN IX, X: gag intact, uvula midline CN XI: sternocleidomastoid and trapezius muscles intact CN XII: tongue midline Bulk & Tone: normal, no fasciculations. Motor:  5/5 throughout *** Sensation:  Pinprick *** temperature *** and vibration sensation intact.  ***. Deep Tendon Reflexes:  2+ throughout, *** toes downgoing.  *** Finger to nose testing:  Without dysmetria.  *** Heel to shin:  Without dysmetria.  *** Gait:  Normal station and stride.  Able to turn and tandem walk. Romberg ***.  IMPRESSION: ***  PLAN: ***  Thank you for allowing me to take part in the care of this patient.  Metta Clines, DO  CC: ***

## 2019-08-15 ENCOUNTER — Ambulatory Visit: Payer: Medicaid Other | Admitting: Neurology

## 2019-10-14 NOTE — Progress Notes (Deleted)
NEUROLOGY CONSULTATION NOTE  Danielle Goodman MRN: [id number] DOB: 1996-03-24  Referring provider: *** Primary care provider: ***  Reason for consult:  Headache  HISTORY OF PRESENT ILLNESS: Danielle Goodman is a 24 year old ***-handed white female with ADHD and migraines who presents for headache  History supplemented by ED note.   ***.  She also has been evaluated for pulsatile tinnitus, ***.  She was evaluated by ENT with no remarkable findings.  CTA of head and neck *** were unremarkable.  She presented to the ED on 06/18/2019 for new headache in which she developed a severe right sided headache ***.  CTA of head and neck personally reviewed was normal.     She previously had an MRI of brain without contrast performed on 06/20/2013 to evaluate a migraine with visual changes while she was pregnant, which showed no acute intracranial abnormality.   Current NSAIDS:  *** Current analgesics:  *** Current triptans:  *** Current ergotamine:  *** Current anti-emetic:  *** Current muscle relaxants:  *** Current anti-anxiolytic:  *** Current sleep aide:  *** Current Antihypertensive medications:  *** Current Antidepressant medications:  *** Current Anticonvulsant medications:  *** Current anti-CGRP:  *** Current Vitamins/Herbal/Supplements:  *** Current Antihistamines/Decongestants:  *** Other therapy:  *** Hormone/birth control:  *** Other medications:  ***   Past NSAIDS:  *** Past analgesics:  *** Past abortive triptans:  *** Past abortive ergotamine:  *** Past muscle relaxants:  *** Past anti-emetic:  *** Past antihypertensive medications:  *** Past antidepressant medications:  *** Past anticonvulsant medications:  *** Past anti-CGRP:  *** Past vitamins/Herbal/Supplements:  *** Past antihistamines/decongestants:  *** Other past therapies:  ***   Caffeine:  *** Alcohol:  *** Smoker:  *** Diet:  *** Exercise:  *** Depression:  ***; Anxiety:  *** Other pain:   *** Sleep hygiene:  *** Family history of headache:  ***  PAST MEDICAL HISTORY: Past Medical History:  Diagnosis Date  . ADHD (attention deficit hyperactivity disorder)   . Asthma   . Headache    Migrainse with pregnancty  . SVD (spontaneous vaginal delivery) 03/17/2016    PAST SURGICAL HISTORY: Past Surgical History:  Procedure Laterality Date  . ADENOIDECTOMY    . TONSILLECTOMY      MEDICATIONS: Current Outpatient Medications on File Prior to Visit  Medication Sig Dispense Refill  . acetaminophen (TYLENOL) 325 MG tablet Take 650 mg by mouth every 4 (four) hours as needed for headache.     . albuterol (VENTOLIN HFA) 108 (90 Base) MCG/ACT inhaler Inhale 2 puffs into the lungs every 4 (four) hours as needed for wheezing or shortness of breath. 8 g 0  . benzonatate (TESSALON) 100 MG capsule Take 1 capsule (100 mg total) by mouth every 8 (eight) hours. 21 capsule 0   No current facility-administered medications on file prior to visit.    ALLERGIES: No Known Allergies  FAMILY HISTORY: Family History  Problem Relation Age of Onset  . Hypertension Mother     SOCIAL HISTORY: Social History   Socioeconomic History  . Marital status: Married    Spouse name: Not on file  . Number of children: Not on file  . Years of education: Not on file  . Highest education level: Not on file  Occupational History  . Not on file  Tobacco Use  . Smoking status: Former Research scientist (life sciences)  . Smokeless tobacco: Never Used  Substance and Sexual Activity  . Alcohol use: No  . Drug use: No  .  Sexual activity: Yes    Birth control/protection: None, Implant  Other Topics Concern  . Not on file  Social History Narrative  . Not on file   Social Determinants of Health   Financial Resource Strain:   . Difficulty of Paying Living Expenses:   Food Insecurity:   . Worried About Programme researcher, broadcasting/film/video in the Last Year:   . Barista in the Last Year:   Transportation Needs:   . Automotive engineer (Medical):   Marland Kitchen Lack of Transportation (Non-Medical):   Physical Activity:   . Days of Exercise per Week:   . Minutes of Exercise per Session:   Stress:   . Feeling of Stress :   Social Connections:   . Frequency of Communication with Friends and Family:   . Frequency of Social Gatherings with Friends and Family:   . Attends Religious Services:   . Active Member of Clubs or Organizations:   . Attends Banker Meetings:   Marland Kitchen Marital Status:   Intimate Partner Violence:   . Fear of Current or Ex-Partner:   . Emotionally Abused:   Marland Kitchen Physically Abused:   . Sexually Abused:     PHYSICAL EXAM: *** General: No acute distress.  Patient appears well-groomed.   Head:  Normocephalic/atraumatic Eyes:  fundi examined but not visualized Neck: supple, no paraspinal tenderness, full range of motion Back: No paraspinal tenderness Heart: regular rate and rhythm Lungs: Clear to auscultation bilaterally. Vascular: No carotid bruits. Neurological Exam: Mental status: alert and oriented to person, place, and time, recent and remote memory intact, fund of knowledge intact, attention and concentration intact, speech fluent and not dysarthric, language intact. Cranial nerves: CN I: not tested CN II: pupils equal, round and reactive to light, visual fields intact CN III, IV, VI:  full range of motion, no nystagmus, no ptosis CN V: facial sensation intact CN VII: upper and lower face symmetric CN VIII: hearing intact CN IX, X: gag intact, uvula midline CN XI: sternocleidomastoid and trapezius muscles intact CN XII: tongue midline Bulk & Tone: normal, no fasciculations. Motor:  5/5 throughout  Sensation:  Pinprick and vibration sensation intact. Deep Tendon Reflexes:  2+ throughout, toes downgoing.  Finger to nose testing:  Without dysmetria.  Heel to shin:  Without dysmetria.  Gait:  Normal station and stride.  Able to turn and tandem walk. Romberg  negative.  IMPRESSION: ***  PLAN: 1.  For preventative management, *** 2.  For abortive therapy, *** 3.  Limit use of pain relievers to no more than 2 days out of week to prevent risk of rebound or medication-overuse headache. 4.  Keep headache diary 5.  Exercise, hydration, caffeine cessation, sleep hygiene, monitor for and avoid triggers 6.  Follow up ***   Thank you for allowing me to take part in the care of this patient.  Shon Millet, DO  CC: ***

## 2019-10-16 ENCOUNTER — Ambulatory Visit: Payer: Medicaid Other | Admitting: Neurology

## 2019-12-22 IMAGING — CT CT RENAL STONE PROTOCOL
2 of 4 series · 16 of 46 positions shown, 18 images · non-contrast
Comparison: None.

CLINICAL DATA: Left flank pain.

EXAM:
CT ABDOMEN AND PELVIS WITHOUT CONTRAST
TECHNIQUE: Multidetector CT imaging of the abdomen and pelvis was performed
following the standard protocol without IV contrast.

[Series 2: axial st · axial · 0.69mm/px · z∈[-611,-251]mm · 13 of 81 slices shown, 15 images]
[im 5/81  soft-tissue]
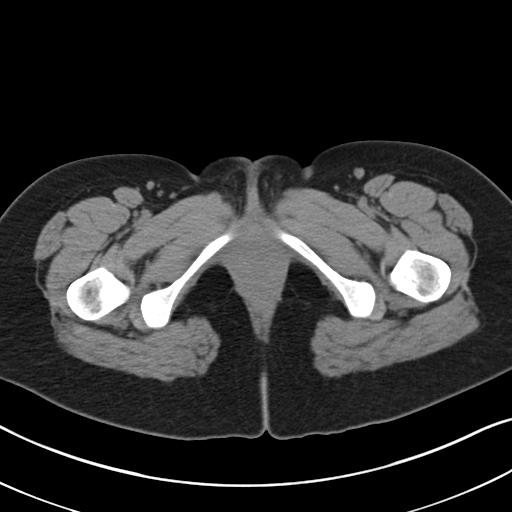
[im 5/81  bone]
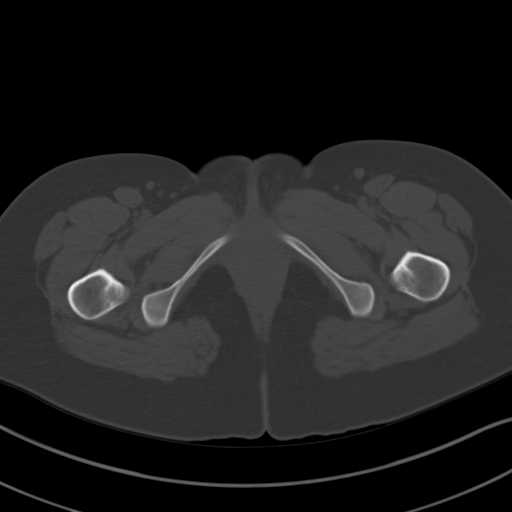
[im 13/81  soft-tissue]
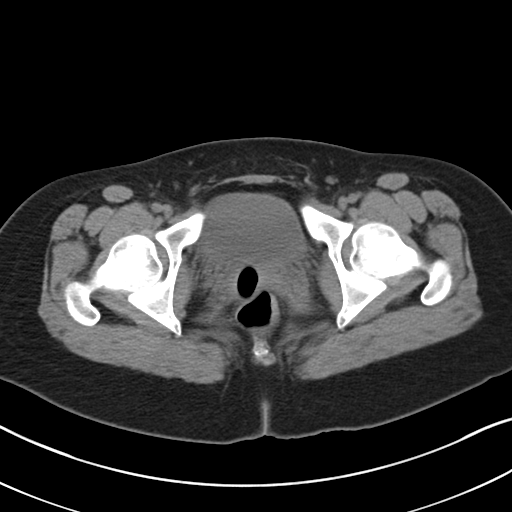
[im 17/81  soft-tissue]
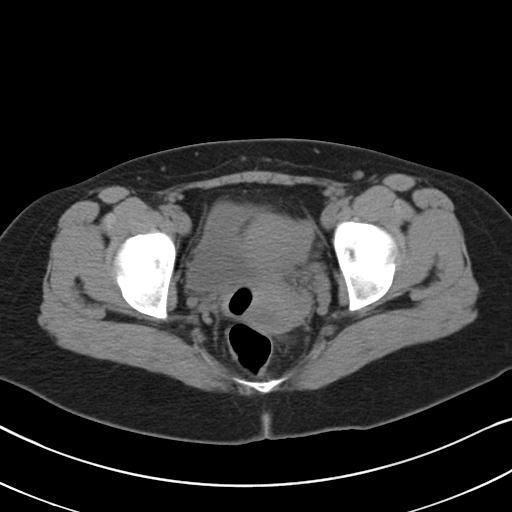
[im 25/81  soft-tissue]
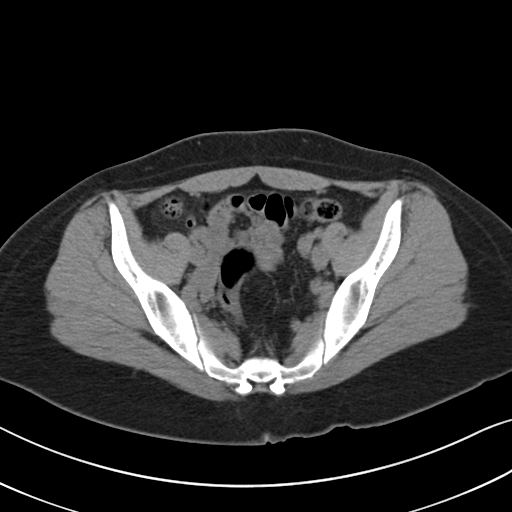
[im 29/81  soft-tissue]
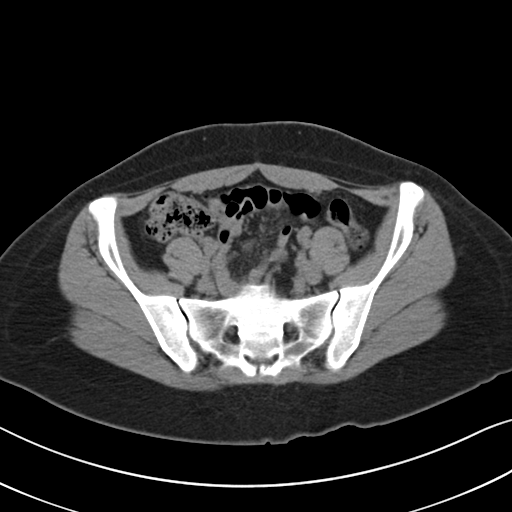
[im 37/81  soft-tissue]
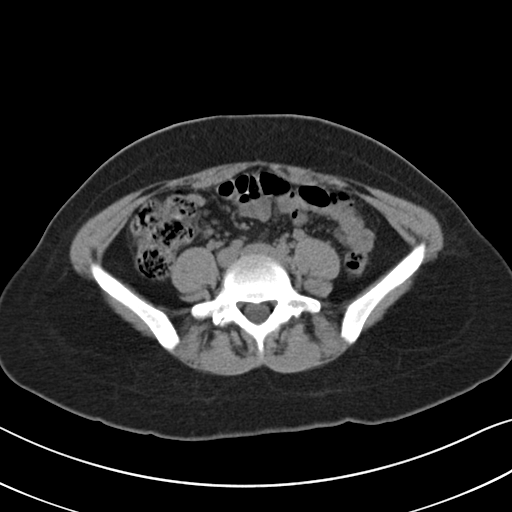
[im 41/81  soft-tissue]
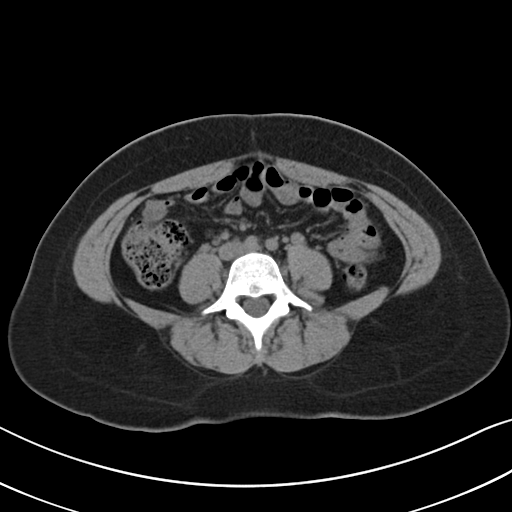
[im 45/81  soft-tissue]
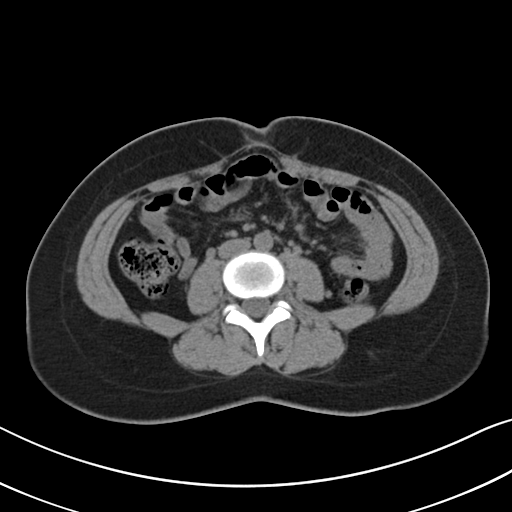
[im 53/81  soft-tissue]
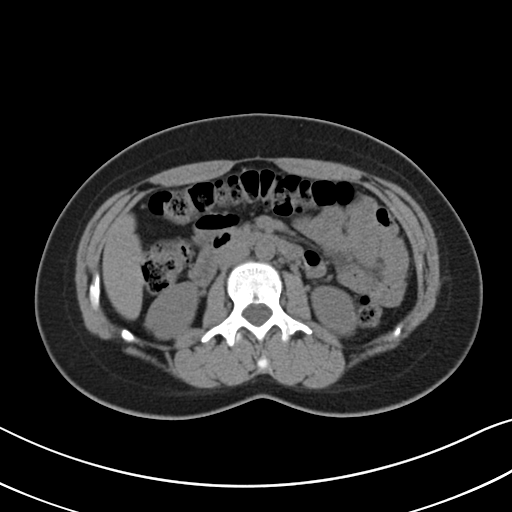
[im 53/81  bone]
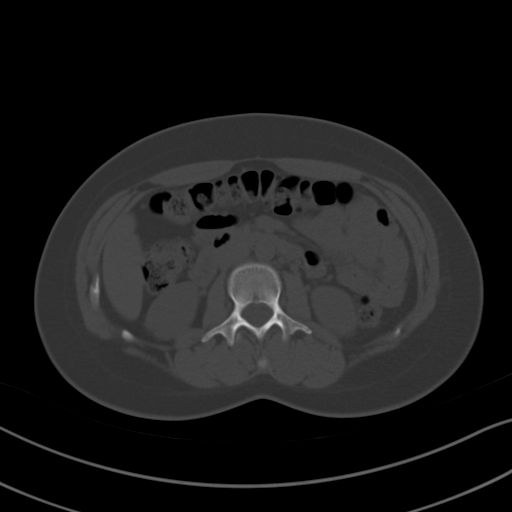
[im 57/81  soft-tissue]
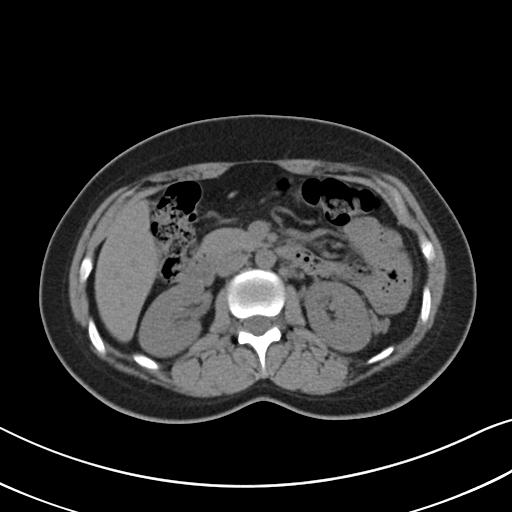
[im 65/81  soft-tissue]
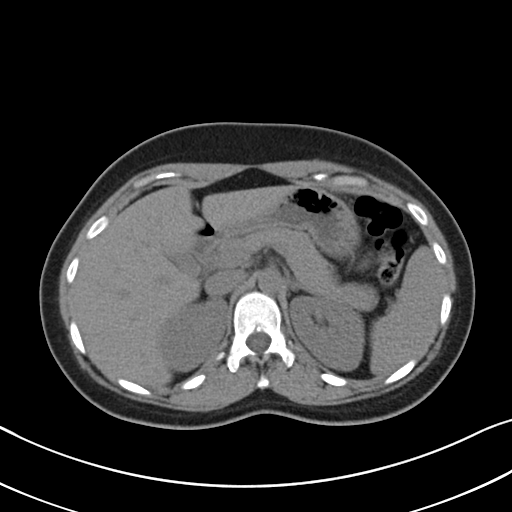
[im 69/81  soft-tissue]
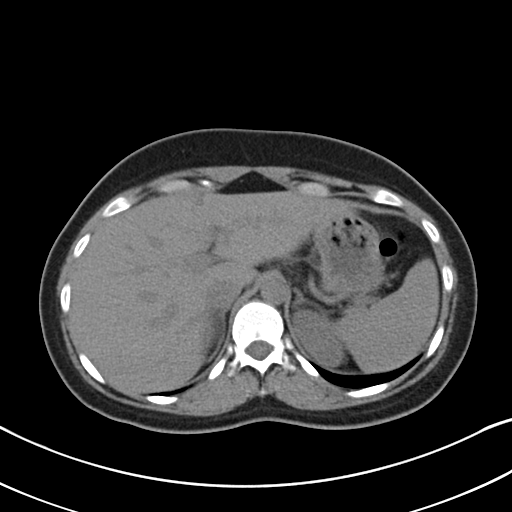
[im 77/81  soft-tissue]
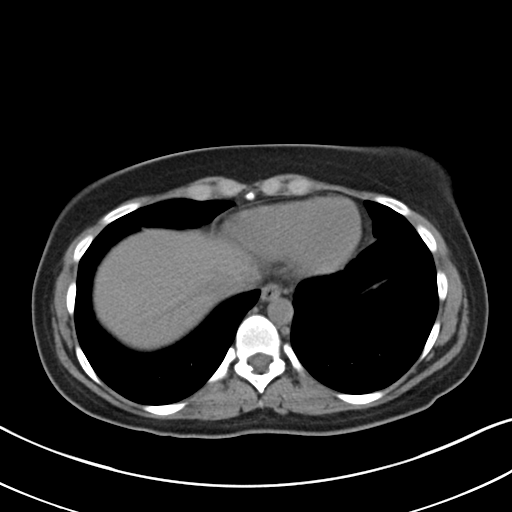

[Series 5: coronal · coronal · 0.64mm/px · 3 of 107 slices shown]
[im 36/107  soft-tissue]
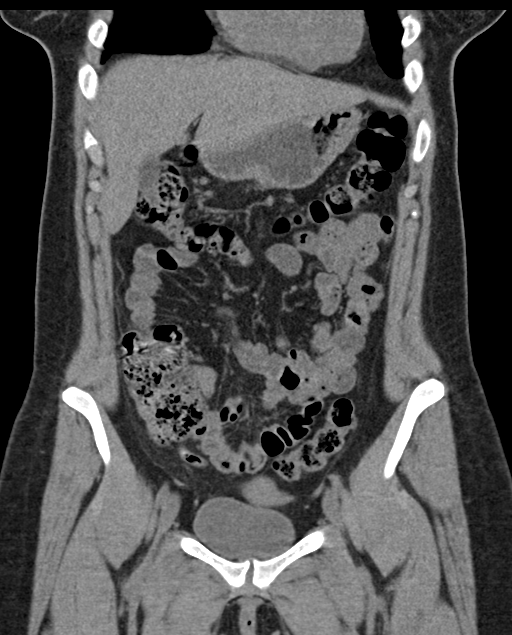
[im 48/107  soft-tissue]
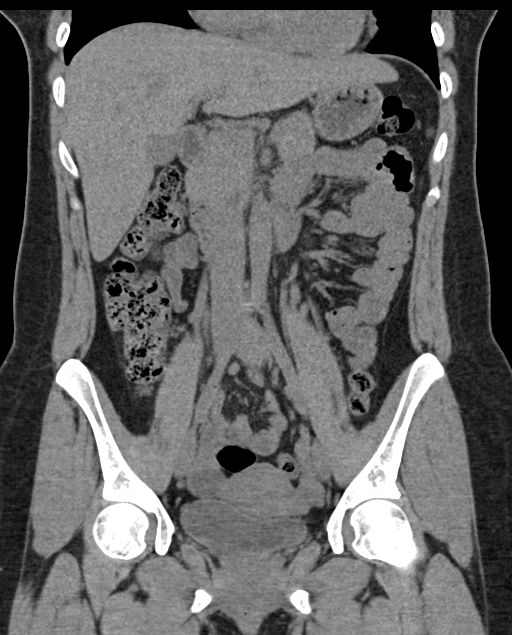
[im 59/107  soft-tissue]
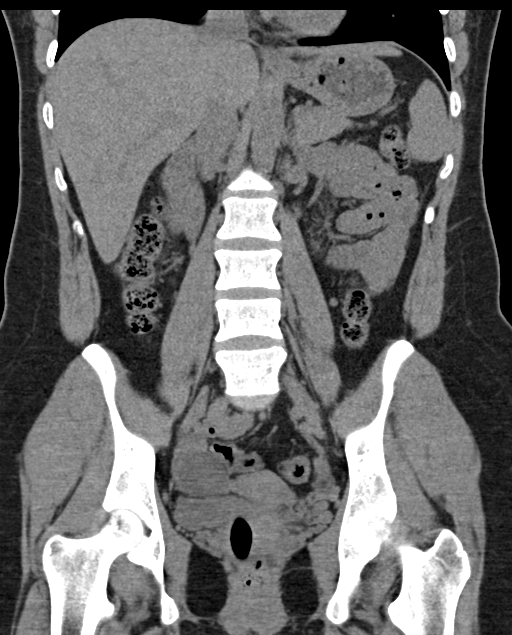

[16 of 46 positions shown; findings below may reference images not displayed]

FINDINGS: Lower chest: No acute abnormality.

Hepatobiliary: No focal liver abnormality is seen. Focal fatty
infiltration of liver at the falciform ligament is identified. No
gallstones, gallbladder wall thickening, or biliary dilatation.

Pancreas: Unremarkable. No pancreatic ductal dilatation or
surrounding inflammatory changes.

Spleen: Normal in size without focal abnormality.

Adrenals/Urinary Tract: Adrenal glands are unremarkable. Kidneys are
normal, without renal calculi, focal lesion, or hydronephrosis.
Bladder is unremarkable.

Stomach/Bowel: Stomach is within normal limits. Appendix appears
normal. No evidence of bowel wall thickening, distention, or
inflammatory changes.

Vascular/Lymphatic: No significant vascular findings are present. No
enlarged abdominal or pelvic lymph nodes.

Reproductive: The uterus is normal. There is a 3.6 x 4 1 cm cyst in
the right ovary likely follicular cyst.

Other: None.

Musculoskeletal: No acute or significant osseous findings.
IMPRESSION: No acute abnormality identified in the abdomen and pelvis.

No nephrolithiasis or hydroureteronephrosis bilaterally.

No bowel obstruction.  The appendix is normal.

## 2021-02-10 ENCOUNTER — Encounter (HOSPITAL_COMMUNITY): Payer: Self-pay | Admitting: Emergency Medicine

## 2021-02-10 ENCOUNTER — Other Ambulatory Visit: Payer: Self-pay

## 2021-02-10 ENCOUNTER — Emergency Department (HOSPITAL_COMMUNITY): Payer: Medicaid Other

## 2021-02-10 ENCOUNTER — Emergency Department (HOSPITAL_COMMUNITY)
Admission: EM | Admit: 2021-02-10 | Discharge: 2021-02-11 | Disposition: A | Discharge status: Home / Self Care | Payer: Medicaid Other | Attending: Physician Assistant | Admitting: Physician Assistant

## 2021-02-10 DIAGNOSIS — K5900 Constipation, unspecified: Secondary | ICD-10-CM | POA: Insufficient documentation

## 2021-02-10 DIAGNOSIS — R509 Fever, unspecified: Secondary | ICD-10-CM | POA: Insufficient documentation

## 2021-02-10 DIAGNOSIS — R1031 Right lower quadrant pain: Secondary | ICD-10-CM | POA: Diagnosis not present

## 2021-02-10 DIAGNOSIS — R630 Anorexia: Secondary | ICD-10-CM | POA: Diagnosis not present

## 2021-02-10 DIAGNOSIS — R110 Nausea: Secondary | ICD-10-CM | POA: Diagnosis not present

## 2021-02-10 DIAGNOSIS — Z5321 Procedure and treatment not carried out due to patient leaving prior to being seen by health care provider: Secondary | ICD-10-CM | POA: Insufficient documentation

## 2021-02-10 LAB — COMPREHENSIVE METABOLIC PANEL
ALT: 32 U/L (ref 0–44)
AST: 26 U/L (ref 15–41)
Albumin: 4.2 g/dL (ref 3.5–5.0)
Alkaline Phosphatase: 77 U/L (ref 38–126)
Anion gap: 6 (ref 5–15)
BUN: 7 mg/dL (ref 6–20)
CO2: 26 mmol/L (ref 22–32)
Calcium: 9.6 mg/dL (ref 8.9–10.3)
Chloride: 109 mmol/L (ref 98–111)
Creatinine, Ser: 0.62 mg/dL (ref 0.44–1.00)
GFR, Estimated: >60 mL/min (ref >60)
Glucose, Bld: 117 mg/dL — ABNORMAL HIGH (ref 70–99)
Potassium: 3.8 mmol/L (ref 3.5–5.1)
Sodium: 141 mmol/L (ref 135–145)
Total Bilirubin: 0.6 mg/dL (ref 0.3–1.2)
Total Protein: 7.4 g/dL (ref 6.5–8.1)

## 2021-02-10 LAB — CBC WITH DIFFERENTIAL/PLATELET
Abs Immature Granulocytes: 0.06 10*3/uL (ref 0.00–0.07)
Basophils Absolute: 0.1 10*3/uL (ref 0.0–0.1)
Basophils Relative: 0 %
Eosinophils Absolute: 0.2 10*3/uL (ref 0.0–0.5)
Eosinophils Relative: 1 %
HCT: 34.2 % — ABNORMAL LOW (ref 36.0–46.0)
Hemoglobin: 11.5 g/dL — ABNORMAL LOW (ref 12.0–15.0)
Immature Granulocytes: 0 %
Lymphocytes Relative: 12 %
Lymphs Abs: 1.7 10*3/uL (ref 0.7–4.0)
MCH: 30.7 pg (ref 26.0–34.0)
MCHC: 33.6 g/dL (ref 30.0–36.0)
MCV: 91.4 fL (ref 80.0–100.0)
Monocytes Absolute: 0.9 10*3/uL (ref 0.1–1.0)
Monocytes Relative: 7 %
Neutro Abs: 10.9 10*3/uL — ABNORMAL HIGH (ref 1.7–7.7)
Neutrophils Relative %: 80 %
Platelets: 391 10*3/uL (ref 150–400)
RBC: 3.74 MIL/uL — ABNORMAL LOW (ref 3.87–5.11)
RDW: 12.4 % (ref 11.5–15.5)
WBC: 13.8 10*3/uL — ABNORMAL HIGH (ref 4.0–10.5)
nRBC: 0 % (ref 0.0–0.2)

## 2021-02-10 LAB — LIPASE, BLOOD: Lipase: 28 U/L (ref 11–51)

## 2021-02-10 LAB — HCG, SERUM, QUALITATIVE: Preg, Serum: NEGATIVE

## 2021-02-10 MED ORDER — IOHEXOL 350 MG/ML SOLN: 80.0000 mL | Freq: Once | INTRAVENOUS | Status: DC | PRN

## 2021-02-10 NOTE — ED Provider Notes (Addendum)
Emergency Medicine Provider Triage Evaluation Note  Danielle Goodman , a 25 y.o. female  was evaluated in triage.  Pt complains of abdominal pain that started this morning.  States her pain is located in the right lower quadrant.  Reports associated nausea.  No vomiting or diarrhea.  Also notes low-grade fevers with a T-max of 100 F.  Denies any dysuria.  Denies vaginal discharge.  States that she has a Nexplanon implant and does not have a menstrual cycle.  Denies a surgical history to the abdomen.  Physical Exam  BP 115/76   Pulse 93   Temp 99.1 F (37.3 C) (Oral)   Resp 18   SpO2 99%  Gen:   Awake, no distress   Resp:  Normal effort  MSK:   Moves extremities without difficulty  Other:  Protuberant abdomen that is soft.  Moderate TTP noted along the right lower quadrant.  Medical Decision Making  Medically screening exam initiated at 5:29 PM.  Appropriate orders placed.  Edwin Lai was informed that the remainder of the evaluation will be completed by another provider, this initial triage assessment does not replace that evaluation, and the importance of remaining in the ED until their evaluation is complete.   Placido Sou, PA-C 02/10/21 1730    Placido Sou, PA-C 02/10/21 1731    Cheryll Cockayne, MD 02/12/21 1526

## 2021-02-10 NOTE — ED Notes (Addendum)
Pt did not respond when called for vitals update 

## 2021-02-10 NOTE — ED Triage Notes (Signed)
Per pt, states right lower quadrant pain since this am-no N/V-

## 2021-02-11 ENCOUNTER — Other Ambulatory Visit: Payer: Self-pay

## 2021-02-11 ENCOUNTER — Encounter (HOSPITAL_COMMUNITY): Payer: Self-pay

## 2021-02-11 ENCOUNTER — Ambulatory Visit (HOSPITAL_COMMUNITY)
Admission: EM | Admit: 2021-02-11 | Discharge: 2021-02-11 | Disposition: A | Discharge status: Home / Self Care | Payer: PRIVATE HEALTH INSURANCE

## 2021-02-11 ENCOUNTER — Emergency Department (HOSPITAL_COMMUNITY)
Admission: EM | Admit: 2021-02-11 | Discharge: 2021-02-12 | Disposition: A | Discharge status: Home / Self Care | Payer: Medicaid Other | Source: Home / Self Care | Attending: Emergency Medicine | Admitting: Emergency Medicine

## 2021-02-11 ENCOUNTER — Emergency Department (HOSPITAL_COMMUNITY)
Admission: EM | Admit: 2021-02-11 | Discharge: 2021-02-11 | Disposition: A | Discharge status: Home / Self Care | Payer: Medicaid Other | Source: Home / Self Care

## 2021-02-11 ENCOUNTER — Encounter (HOSPITAL_COMMUNITY): Payer: Self-pay | Admitting: Emergency Medicine

## 2021-02-11 DIAGNOSIS — K5900 Constipation, unspecified: Secondary | ICD-10-CM | POA: Insufficient documentation

## 2021-02-11 DIAGNOSIS — Z5321 Procedure and treatment not carried out due to patient leaving prior to being seen by health care provider: Secondary | ICD-10-CM | POA: Insufficient documentation

## 2021-02-11 DIAGNOSIS — R1031 Right lower quadrant pain: Secondary | ICD-10-CM | POA: Insufficient documentation

## 2021-02-11 DIAGNOSIS — R110 Nausea: Secondary | ICD-10-CM | POA: Insufficient documentation

## 2021-02-11 DIAGNOSIS — R509 Fever, unspecified: Secondary | ICD-10-CM | POA: Insufficient documentation

## 2021-02-11 DIAGNOSIS — R630 Anorexia: Secondary | ICD-10-CM | POA: Insufficient documentation

## 2021-02-11 LAB — CBC WITH DIFFERENTIAL/PLATELET
Abs Immature Granulocytes: 0.03 10*3/uL (ref 0.00–0.07)
Basophils Absolute: 0.1 10*3/uL (ref 0.0–0.1)
Basophils Relative: 1 %
Eosinophils Absolute: 0.1 10*3/uL (ref 0.0–0.5)
Eosinophils Relative: 2 %
HCT: 34.8 % — ABNORMAL LOW (ref 36.0–46.0)
Hemoglobin: 11.2 g/dL — ABNORMAL LOW (ref 12.0–15.0)
Immature Granulocytes: 0 %
Lymphocytes Relative: 19 %
Lymphs Abs: 1.8 10*3/uL (ref 0.7–4.0)
MCH: 30.2 pg (ref 26.0–34.0)
MCHC: 32.2 g/dL (ref 30.0–36.0)
MCV: 93.8 fL (ref 80.0–100.0)
Monocytes Absolute: 0.7 10*3/uL (ref 0.1–1.0)
Monocytes Relative: 8 %
Neutro Abs: 6.6 10*3/uL (ref 1.7–7.7)
Neutrophils Relative %: 70 %
Platelets: 402 10*3/uL — ABNORMAL HIGH (ref 150–400)
RBC: 3.71 MIL/uL — ABNORMAL LOW (ref 3.87–5.11)
RDW: 12.3 % (ref 11.5–15.5)
WBC: 9.4 10*3/uL (ref 4.0–10.5)
nRBC: 0 % (ref 0.0–0.2)

## 2021-02-11 LAB — BASIC METABOLIC PANEL
Anion gap: 5 (ref 5–15)
BUN: <5 mg/dL — ABNORMAL LOW (ref 6–20)
CO2: 27 mmol/L (ref 22–32)
Calcium: 9.1 mg/dL (ref 8.9–10.3)
Chloride: 107 mmol/L (ref 98–111)
Creatinine, Ser: 0.51 mg/dL (ref 0.44–1.00)
GFR, Estimated: >60 mL/min (ref >60)
Glucose, Bld: 117 mg/dL — ABNORMAL HIGH (ref 70–99)
Potassium: 3.6 mmol/L (ref 3.5–5.1)
Sodium: 139 mmol/L (ref 135–145)

## 2021-02-11 NOTE — ED Provider Notes (Signed)
[facility]    CSN: [id number] Arrival date & time: 02/11/21  1222      History   Chief Complaint Chief Complaint  Patient presents with   Fever   Abdominal Pain    HPI Danielle Goodman is a 25 y.o. female.   Patient here today for evaluation of right lower quadrant pain that started yesterday.  She reports he has had low-grade fever with T-max of 100.7.  She was seen in the ED yesterday but had to leave due to childcare concerns.  Her white blood cell count at that time was over 13,000.  Her CMP was normal.  She reports she has had some nausea, constipation.  Does not report any treatment for symptoms.  The history is provided by the patient.  Fever Associated symptoms: nausea   Associated symptoms: no vomiting   Abdominal Pain Associated symptoms: fever and nausea   Associated symptoms: no shortness of breath and no vomiting    Past Medical History:  Diagnosis Date   ADHD (attention deficit hyperactivity disorder)    Asthma    Headache    Migrainse with pregnancty   SVD (spontaneous vaginal delivery) 03/17/2016    Patient Active Problem List   Diagnosis Date Noted   Normal labor and delivery 03/17/2016   SVD (spontaneous vaginal delivery) 03/17/2016   Premature rupture of membranes 07/10/2013    Past Surgical History:  Procedure Laterality Date   ADENOIDECTOMY     TONSILLECTOMY      OB History     Gravida  2   Para  2   Term  2   Preterm      AB      Living  2      SAB      IAB      Ectopic      Multiple  0   Live Births  2            Home Medications    Prior to Admission medications   Medication Sig Start Date End Date Taking? Authorizing Provider  acetaminophen (TYLENOL) 325 MG tablet Take 650 mg by mouth every 4 (four) hours as needed for headache.     [provider]  albuterol (VENTOLIN HFA) 108 (90 Base) MCG/ACT inhaler Inhale 2 puffs into the lungs every 4 (four) hours as needed for wheezing or  shortness of breath. 06/21/19   Henderly, Britni A, PA-C  benzonatate (TESSALON) 100 MG capsule Take 1 capsule (100 mg total) by mouth every 8 (eight) hours. 06/21/19   Henderly, Britni A, PA-C    Family History Family History  Problem Relation Age of Onset   Hypertension Mother     Social History Social History   Tobacco Use   Smoking status: Former   Smokeless tobacco: Never  Building services engineer Use: Never used  Substance Use Topics   Alcohol use: No   Drug use: No     Allergies   Patient has no known allergies.   Review of Systems Review of Systems  Constitutional:  Positive for fever.  Respiratory:  Negative for shortness of breath.   Gastrointestinal:  Positive for abdominal pain and nausea. Negative for vomiting.    Physical Exam Triage Vital Signs ED Triage Vitals [02/11/21 1252]  Enc Vitals Group     BP 128/65     Pulse Rate 76     Resp 17     Temp 98.2 F (36.8 C)  Temp src      SpO2 98 %     Weight      Height      Head Circumference      Peak Flow      Pain Score 6     Pain Loc      Pain Edu?      Excl. in GC?    No data found.  Updated Vital Signs BP 128/65   Pulse 76   Temp 98.2 F (36.8 C)   Resp 17   SpO2 98%   Breastfeeding No   Visual Acuity Right Eye Distance:   Left Eye Distance:   Bilateral Distance:    Right Eye Near:   Left Eye Near:    Bilateral Near:     Physical Exam Vitals and nursing note reviewed.  Constitutional:      Appearance: She is well-developed.  HENT:     Head: Normocephalic and atraumatic.  Cardiovascular:     Rate and Rhythm: Normal rate and regular rhythm.     Heart sounds: No murmur heard. Pulmonary:     Effort: Pulmonary effort is normal. No respiratory distress.     Breath sounds: Normal breath sounds. No wheezing, rhonchi or rales.  Abdominal:     General: Abdomen is flat. Bowel sounds are normal.     Palpations: Abdomen is soft.     Tenderness: There is abdominal tenderness in the  right lower quadrant. There is guarding.  Neurological:     Mental Status: She is alert.     UC Treatments / Results  Labs (all labs ordered are listed, but only abnormal results are displayed) Labs Reviewed - No data to display  EKG   Radiology No results found.  Procedures Procedures (including critical care time)  Medications Ordered in UC Medications - No data to display  Initial Impression / Assessment and Plan / UC Course  I have reviewed the triage vital signs and the nursing notes.  Pertinent labs & imaging results that were available during my care of the patient were reviewed by me and considered in my medical decision making (see chart for details).  Concern for possible appendicitis.  Discussed that she would need CT scan to rule this out.  Patient is agreeable to be seen in the ED.  Final Clinical Impressions(s) / UC Diagnoses   Final diagnoses:  RLQ abdominal pain     Discharge Instructions      Concern for possible appendicitis. Please report to ED immediately for further evaluation     ED Prescriptions   None    PDMP not reviewed this encounter.   Tomi Bamberger, PA-C 02/11/21 1348

## 2021-02-11 NOTE — ED Provider Notes (Addendum)
Emergency Medicine Provider Triage Evaluation Note  Danielle Goodman , a 25 y.o. female  was evaluated in triage.  Pt complains of abdominal pain of 2-day duration.  Patient was seen at urgent care and referred to emergency rooms for concern for appendicitis.   Review of Systems  Positive: Abdominal pain, nausea, fever, constipation, anorexia Negative: Vomiting, vaginal discharge  Physical Exam  BP 130/81 (BP Location: Right Arm)   Pulse 75   Temp 98.6 F (37 C) (Oral)   Resp 16   SpO2 100%  Gen:   Awake, no distress   Resp:  Normal effort  MSK:   Moves extremities without difficulty  Other:    Medical Decision Making  Medically screening exam initiated at 4:15 PM.  Appropriate orders placed.  Danielle Goodman was informed that the remainder of the evaluation will be completed by another provider, this initial triage assessment does not replace that evaluation, and the importance of remaining in the ED until their evaluation is complete.  Right lower quadrant abdominal pain   Danielle Kansas, PA-C 02/11/21 1617    Danielle Kansas, PA-C 02/11/21 1619    Glendora Score, MD 02/11/21 [id number]

## 2021-02-11 NOTE — ED Triage Notes (Signed)
Pt is present today with right side abdominal pain and fever. Pt states that her sx started one day ago.

## 2021-02-11 NOTE — ED Notes (Signed)
Pt turned in her labels and sts she is unable to wait

## 2021-02-11 NOTE — Discharge Instructions (Addendum)
Concern for possible appendicitis. Please report to ED immediately for further evaluation

## 2021-02-11 NOTE — ED Triage Notes (Signed)
Pt reports RLQ pain for the past 3 days, went to [facility] last night but LWBS after triage. Pt has some nausea but no vomiting.

## 2021-02-12 ENCOUNTER — Emergency Department: Payer: Medicaid Other

## 2021-02-12 ENCOUNTER — Encounter
Admission: EM | Disposition: A | Discharge status: Home / Self Care | Payer: Self-pay | Source: Home / Self Care | Attending: Emergency Medicine

## 2021-02-12 ENCOUNTER — Encounter: Payer: Self-pay | Admitting: Emergency Medicine

## 2021-02-12 ENCOUNTER — Observation Stay: Payer: Medicaid Other | Admitting: Certified Registered"

## 2021-02-12 ENCOUNTER — Observation Stay
Admission: EM | Admit: 2021-02-12 | Discharge: 2021-02-13 | Disposition: A | Discharge status: Home / Self Care | Payer: Medicaid Other | Attending: Surgery | Admitting: Surgery

## 2021-02-12 DIAGNOSIS — J45909 Unspecified asthma, uncomplicated: Secondary | ICD-10-CM | POA: Insufficient documentation

## 2021-02-12 DIAGNOSIS — K3580 Unspecified acute appendicitis: Principal | ICD-10-CM | POA: Diagnosis present

## 2021-02-12 DIAGNOSIS — Z20822 Contact with and (suspected) exposure to covid-19: Secondary | ICD-10-CM | POA: Insufficient documentation

## 2021-02-12 DIAGNOSIS — Z87891 Personal history of nicotine dependence: Secondary | ICD-10-CM | POA: Diagnosis not present

## 2021-02-12 DIAGNOSIS — R1031 Right lower quadrant pain: Secondary | ICD-10-CM | POA: Diagnosis present

## 2021-02-12 DIAGNOSIS — Z79899 Other long term (current) drug therapy: Secondary | ICD-10-CM | POA: Insufficient documentation

## 2021-02-12 HISTORY — PX: LAPAROSCOPIC APPENDECTOMY: SHX408

## 2021-02-12 LAB — COMPREHENSIVE METABOLIC PANEL
ALT: 26 U/L (ref 0–44)
AST: 19 U/L (ref 15–41)
Albumin: 3.9 g/dL (ref 3.5–5.0)
Alkaline Phosphatase: 65 U/L (ref 38–126)
Anion gap: 7 (ref 5–15)
BUN: 8 mg/dL (ref 6–20)
CO2: 26 mmol/L (ref 22–32)
Calcium: 9.2 mg/dL (ref 8.9–10.3)
Chloride: 104 mmol/L (ref 98–111)
Creatinine, Ser: 0.53 mg/dL (ref 0.44–1.00)
GFR, Estimated: >60 mL/min (ref >60)
Glucose, Bld: 99 mg/dL (ref 70–99)
Potassium: 4.1 mmol/L (ref 3.5–5.1)
Sodium: 137 mmol/L (ref 135–145)
Total Bilirubin: 0.7 mg/dL (ref 0.3–1.2)
Total Protein: 7.1 g/dL (ref 6.5–8.1)

## 2021-02-12 LAB — URINALYSIS, COMPLETE (UACMP) WITH MICROSCOPIC
Bilirubin Urine: NEGATIVE
Glucose, UA: NEGATIVE mg/dL
Ketones, ur: NEGATIVE mg/dL
Leukocytes,Ua: NEGATIVE
Nitrite: NEGATIVE
Protein, ur: NEGATIVE mg/dL
Specific Gravity, Urine: 1.011 (ref 1.005–1.030)
pH: 6 (ref 5.0–8.0)

## 2021-02-12 LAB — CBC WITH DIFFERENTIAL/PLATELET
Abs Immature Granulocytes: 0.02 10*3/uL (ref 0.00–0.07)
Basophils Absolute: 0 10*3/uL (ref 0.0–0.1)
Basophils Relative: 0 %
Eosinophils Absolute: 0.2 10*3/uL (ref 0.0–0.5)
Eosinophils Relative: 2 %
HCT: 34.7 % — ABNORMAL LOW (ref 36.0–46.0)
Hemoglobin: 11.6 g/dL — ABNORMAL LOW (ref 12.0–15.0)
Immature Granulocytes: 0 %
Lymphocytes Relative: 18 %
Lymphs Abs: 1.5 10*3/uL (ref 0.7–4.0)
MCH: 30.4 pg (ref 26.0–34.0)
MCHC: 33.4 g/dL (ref 30.0–36.0)
MCV: 90.8 fL (ref 80.0–100.0)
Monocytes Absolute: 0.6 10*3/uL (ref 0.1–1.0)
Monocytes Relative: 8 %
Neutro Abs: 5.7 10*3/uL (ref 1.7–7.7)
Neutrophils Relative %: 72 %
Platelets: 378 10*3/uL (ref 150–400)
RBC: 3.82 MIL/uL — ABNORMAL LOW (ref 3.87–5.11)
RDW: 12.3 % (ref 11.5–15.5)
WBC: 8 10*3/uL (ref 4.0–10.5)
nRBC: 0 % (ref 0.0–0.2)

## 2021-02-12 LAB — PREGNANCY, URINE: Preg Test, Ur: NEGATIVE

## 2021-02-12 LAB — RESP PANEL BY RT-PCR (FLU A&B, COVID) ARPGX2
Influenza A by PCR: NEGATIVE
Influenza B by PCR: NEGATIVE
SARS Coronavirus 2 by RT PCR: NEGATIVE

## 2021-02-12 LAB — LIPASE, BLOOD: Lipase: 29 U/L (ref 11–51)

## 2021-02-12 SURGERY — APPENDECTOMY, LAPAROSCOPIC
Anesthesia: General

## 2021-02-12 MED ORDER — ALBUTEROL SULFATE HFA 108 (90 BASE) MCG/ACT IN AERS: 2.0000 | INHALATION_SPRAY | Freq: Four times a day (QID) | RESPIRATORY_TRACT | Status: DC | PRN

## 2021-02-12 MED ORDER — FENTANYL CITRATE (PF) 100 MCG/2ML IJ SOLN: 25.0000 ug | INTRAMUSCULAR | Status: DC | PRN

## 2021-02-12 MED ORDER — NICOTINE 14 MG/24HR TD PT24: 14.0000 mg | MEDICATED_PATCH | Freq: Every day | TRANSDERMAL | Status: DC

## 2021-02-12 MED ORDER — BUPIVACAINE-EPINEPHRINE (PF) 0.5% -1:200000 IJ SOLN
INTRAMUSCULAR | Status: DC | PRN
  Administered 2021-02-12: 30 mL via PERINEURAL

## 2021-02-12 MED ORDER — MIDAZOLAM HCL 2 MG/2ML IJ SOLN
INTRAMUSCULAR | Status: DC | PRN
  Administered 2021-02-12: 2 mg via INTRAVENOUS

## 2021-02-12 MED ORDER — MIDAZOLAM HCL 2 MG/2ML IJ SOLN
INTRAMUSCULAR | Status: AC
  Filled 2021-02-12: qty 2

## 2021-02-12 MED ORDER — ONDANSETRON HCL 4 MG/2ML IJ SOLN
4.0000 mg | Freq: Four times a day (QID) | INTRAMUSCULAR | Status: DC | PRN
  Administered 2021-02-12: 4 mg via INTRAVENOUS
  Filled 2021-02-12: qty 2

## 2021-02-12 MED ORDER — PIPERACILLIN-TAZOBACTAM 3.375 G IVPB
INTRAVENOUS | Status: AC
  Filled 2021-02-12: qty 50

## 2021-02-12 MED ORDER — LACTATED RINGERS IV SOLN
INTRAVENOUS | Status: DC
Start: 2021-02-12 — End: 2021-02-13

## 2021-02-12 MED ORDER — OXYCODONE HCL 5 MG/5ML PO SOLN: 5.0000 mg | Freq: Once | ORAL | Status: AC | PRN

## 2021-02-12 MED ORDER — PROPOFOL 10 MG/ML IV BOLUS
INTRAVENOUS | Status: DC | PRN
  Administered 2021-02-12: 200 mg via INTRAVENOUS

## 2021-02-12 MED ORDER — KETOROLAC TROMETHAMINE 30 MG/ML IJ SOLN
INTRAMUSCULAR | Status: DC | PRN
  Administered 2021-02-12: 30 mg via INTRAVENOUS

## 2021-02-12 MED ORDER — IOHEXOL 350 MG/ML SOLN
80.0000 mL | Freq: Once | INTRAVENOUS | Status: AC | PRN
  Administered 2021-02-12: 80 mL via INTRAVENOUS

## 2021-02-12 MED ORDER — ACETAMINOPHEN 10 MG/ML IV SOLN
INTRAVENOUS | Status: DC | PRN
  Administered 2021-02-12: 1000 mg via INTRAVENOUS

## 2021-02-12 MED ORDER — SUCCINYLCHOLINE CHLORIDE 200 MG/10ML IV SOSY
PREFILLED_SYRINGE | INTRAVENOUS | Status: DC | PRN
  Administered 2021-02-12: 120 mg via INTRAVENOUS

## 2021-02-12 MED ORDER — PROMETHAZINE HCL 25 MG/ML IJ SOLN: 6.2500 mg | INTRAMUSCULAR | Status: DC | PRN

## 2021-02-12 MED ORDER — ALBUTEROL SULFATE (2.5 MG/3ML) 0.083% IN NEBU: 2.5000 mg | INHALATION_SOLUTION | Freq: Four times a day (QID) | RESPIRATORY_TRACT | Status: DC | PRN

## 2021-02-12 MED ORDER — DEXAMETHASONE SODIUM PHOSPHATE 10 MG/ML IJ SOLN
INTRAMUSCULAR | Status: DC | PRN
  Administered 2021-02-12: 10 mg via INTRAVENOUS

## 2021-02-12 MED ORDER — PIPERACILLIN-TAZOBACTAM 3.375 G IVPB
3.3750 g | Freq: Three times a day (TID) | INTRAVENOUS | Status: DC
  Administered 2021-02-12 – 2021-02-13 (×3): 3.375 g via INTRAVENOUS
  Filled 2021-02-12 (×5): qty 50

## 2021-02-12 MED ORDER — PANTOPRAZOLE SODIUM 40 MG IV SOLR
40.0000 mg | Freq: Every day | INTRAVENOUS | Status: DC
  Filled 2021-02-12 (×2): qty 40

## 2021-02-12 MED ORDER — ONDANSETRON HCL 4 MG/2ML IJ SOLN
INTRAMUSCULAR | Status: DC | PRN
  Administered 2021-02-12: 4 mg via INTRAVENOUS

## 2021-02-12 MED ORDER — FENTANYL CITRATE (PF) 100 MCG/2ML IJ SOLN
INTRAMUSCULAR | Status: DC | PRN
  Administered 2021-02-12 (×5): 50 ug via INTRAVENOUS

## 2021-02-12 MED ORDER — FENTANYL CITRATE (PF) 250 MCG/5ML IJ SOLN
INTRAMUSCULAR | Status: AC
  Filled 2021-02-12: qty 5

## 2021-02-12 MED ORDER — 0.9 % SODIUM CHLORIDE (POUR BTL) OPTIME
TOPICAL | Status: DC | PRN
  Administered 2021-02-12: 200 mL

## 2021-02-12 MED ORDER — ONDANSETRON 4 MG PO TBDP: 4.0000 mg | ORAL_TABLET | Freq: Four times a day (QID) | ORAL | Status: DC | PRN

## 2021-02-12 MED ORDER — OXYCODONE HCL 5 MG PO TABS
5.0000 mg | ORAL_TABLET | ORAL | Status: DC | PRN
  Administered 2021-02-12 – 2021-02-13 (×4): 5 mg via ORAL
  Filled 2021-02-12 (×3): qty 1
  Filled 2021-02-12: qty 2

## 2021-02-12 MED ORDER — ROCURONIUM BROMIDE 10 MG/ML (PF) SYRINGE
PREFILLED_SYRINGE | INTRAVENOUS | Status: AC
  Filled 2021-02-12: qty 10

## 2021-02-12 MED ORDER — OXYCODONE HCL 5 MG PO TABS: 5.0000 mg | ORAL_TABLET | Freq: Once | ORAL | Status: AC | PRN

## 2021-02-12 MED ORDER — BUPIVACAINE-EPINEPHRINE (PF) 0.5% -1:200000 IJ SOLN
INTRAMUSCULAR | Status: AC
  Filled 2021-02-12: qty 30

## 2021-02-12 MED ORDER — OXYCODONE HCL 5 MG PO TABS
ORAL_TABLET | ORAL | Status: AC
  Administered 2021-02-12: 5 mg via ORAL
  Filled 2021-02-12: qty 1

## 2021-02-12 MED ORDER — HYDROMORPHONE HCL 1 MG/ML IJ SOLN: 0.5000 mg | INTRAMUSCULAR | Status: DC | PRN

## 2021-02-12 MED ORDER — ACETAMINOPHEN 500 MG PO TABS
1000.0000 mg | ORAL_TABLET | Freq: Four times a day (QID) | ORAL | Status: DC
  Administered 2021-02-13 (×2): 1000 mg via ORAL
  Filled 2021-02-12 (×3): qty 2

## 2021-02-12 MED ORDER — LIDOCAINE HCL (PF) 2 % IJ SOLN
INTRAMUSCULAR | Status: AC
  Filled 2021-02-12: qty 5

## 2021-02-12 MED ORDER — POLYETHYLENE GLYCOL 3350 17 G PO PACK
17.0000 g | PACK | Freq: Every day | ORAL | Status: DC | PRN
  Filled 2021-02-12: qty 1

## 2021-02-12 MED ORDER — ALBUTEROL SULFATE HFA 108 (90 BASE) MCG/ACT IN AERS
1.0000 | INHALATION_SPRAY | Freq: Four times a day (QID) | RESPIRATORY_TRACT | Status: DC | PRN
  Administered 2021-02-13: 2 via RESPIRATORY_TRACT
  Filled 2021-02-12: qty 6.7

## 2021-02-12 MED ORDER — PIPERACILLIN-TAZOBACTAM 3.375 G IVPB 30 MIN: 3.3750 g | Freq: Once | INTRAVENOUS | Status: DC

## 2021-02-12 MED ORDER — LIDOCAINE HCL (CARDIAC) PF 100 MG/5ML IV SOSY
PREFILLED_SYRINGE | INTRAVENOUS | Status: DC | PRN
  Administered 2021-02-12: 100 mg via INTRAVENOUS

## 2021-02-12 MED ORDER — KETOROLAC TROMETHAMINE 30 MG/ML IJ SOLN: 30.0000 mg | Freq: Four times a day (QID) | INTRAMUSCULAR | Status: DC

## 2021-02-12 MED ORDER — ALPRAZOLAM 0.5 MG PO TABS: 0.5000 mg | ORAL_TABLET | Freq: Three times a day (TID) | ORAL | Status: DC | PRN

## 2021-02-12 MED ORDER — MEPERIDINE HCL 25 MG/ML IJ SOLN: 6.2500 mg | INTRAMUSCULAR | Status: DC | PRN

## 2021-02-12 MED ORDER — PROPOFOL 10 MG/ML IV BOLUS
INTRAVENOUS | Status: AC
  Filled 2021-02-12: qty 20

## 2021-02-12 MED ORDER — ACETAMINOPHEN 10 MG/ML IV SOLN
INTRAVENOUS | Status: AC
  Filled 2021-02-12: qty 100

## 2021-02-12 MED ORDER — SEVOFLURANE IN SOLN
RESPIRATORY_TRACT | Status: AC
  Filled 2021-02-12: qty 250

## 2021-02-12 MED ORDER — SUGAMMADEX SODIUM 200 MG/2ML IV SOLN
INTRAVENOUS | Status: DC | PRN
  Administered 2021-02-12: 200 mg via INTRAVENOUS

## 2021-02-12 MED ORDER — ROCURONIUM BROMIDE 100 MG/10ML IV SOLN
INTRAVENOUS | Status: DC | PRN
Start: 2021-02-12 — End: 2021-02-12
  Administered 2021-02-12: 50 mg via INTRAVENOUS

## 2021-02-12 MED ORDER — IOHEXOL 9 MG/ML PO SOLN
500.0000 mL | ORAL | Status: AC
  Administered 2021-02-12 (×2): 500 mL via ORAL

## 2021-02-12 MED ORDER — LACTATED RINGERS IV SOLN
125.0000 mL/h | INTRAVENOUS | Status: DC
  Administered 2021-02-12: 125 mL/h via INTRAVENOUS

## 2021-02-12 SURGICAL SUPPLY — 43 items
CHLORAPREP W/TINT 26 (MISCELLANEOUS) ×2 IMPLANT
CUTTER FLEX LINEAR 45M (STAPLE) IMPLANT
DERMABOND ADVANCED (GAUZE/BANDAGES/DRESSINGS) ×1
DERMABOND ADVANCED .7 DNX12 (GAUZE/BANDAGES/DRESSINGS) ×1 IMPLANT
ELECT CAUTERY BLADE TIP 2.5 (TIP) ×2
ELECT REM PT RETURN 9FT ADLT (ELECTROSURGICAL) ×2
ELECTRODE CAUTERY BLDE TIP 2.5 (TIP) ×1 IMPLANT
ELECTRODE REM PT RTRN 9FT ADLT (ELECTROSURGICAL) ×1 IMPLANT
GAUZE 4X4 16PLY ~~LOC~~+RFID DBL (SPONGE) ×2 IMPLANT
GLOVE SURG SYN 7.0 (GLOVE) ×2 IMPLANT
GLOVE SURG SYN 7.5  E (GLOVE) ×1
GLOVE SURG SYN 7.5 E (GLOVE) ×1 IMPLANT
GOWN STRL REUS W/ TWL LRG LVL3 (GOWN DISPOSABLE) ×2 IMPLANT
GOWN STRL REUS W/TWL LRG LVL3 (GOWN DISPOSABLE) ×2
IRRIGATION STRYKERFLOW (MISCELLANEOUS) IMPLANT
IRRIGATOR STRYKERFLOW (MISCELLANEOUS)
IV NS 1000ML (IV SOLUTION) ×2
IV NS 1000ML BAXH (IV SOLUTION) ×1 IMPLANT
KIT TURNOVER KIT A (KITS) ×2 IMPLANT
LABEL OR SOLS (LABEL) ×2 IMPLANT
LIGASURE LAP MARYLAND 5MM 37CM (ELECTROSURGICAL) ×2 IMPLANT
MANIFOLD NEPTUNE II (INSTRUMENTS) ×2 IMPLANT
NEEDLE HYPO 22GX1.5 SAFETY (NEEDLE) ×2 IMPLANT
NS IRRIG 500ML POUR BTL (IV SOLUTION) ×2 IMPLANT
PACK LAP CHOLECYSTECTOMY (MISCELLANEOUS) ×2 IMPLANT
PENCIL ELECTRO HAND CTR (MISCELLANEOUS) ×2 IMPLANT
POUCH SPECIMEN RETRIEVAL 10MM (ENDOMECHANICALS) ×2 IMPLANT
RELOAD 45 VASCULAR/THIN (ENDOMECHANICALS) IMPLANT
RELOAD STAPLE TA45 3.5 REG BLU (ENDOMECHANICALS) ×2 IMPLANT
SCISSORS METZENBAUM CVD 33 (INSTRUMENTS) ×2 IMPLANT
SLEEVE ADV FIXATION 5X100MM (TROCAR) ×2 IMPLANT
SUT MNCRL 4-0 (SUTURE) ×2
SUT MNCRL 4-0 27XMFL (SUTURE) ×1
SUT VIC AB 3-0 SH 27 (SUTURE) ×2
SUT VIC AB 3-0 SH 27X BRD (SUTURE) ×1 IMPLANT
SUT VICRYL 0 AB UR-6 (SUTURE) ×2 IMPLANT
SUTURE MNCRL 4-0 27XMF (SUTURE) ×1 IMPLANT
SYS KII FIOS ACCESS ABD 5X100 (TROCAR) ×2
SYSTEM KII FIOS ACES ABD 5X100 (TROCAR) ×1 IMPLANT
TRAY FOLEY MTR SLVR 16FR STAT (SET/KITS/TRAYS/PACK) ×2 IMPLANT
TROCAR BALLN GELPORT 12X130M (ENDOMECHANICALS) ×2 IMPLANT
TUBING EVAC SMOKE HEATED PNEUM (TUBING) ×2 IMPLANT
WATER STERILE IRR 500ML POUR (IV SOLUTION) ×2 IMPLANT

## 2021-02-12 NOTE — ED Triage Notes (Signed)
Pt to ED via POV stating that she has had fever, RLQ abdominal pain and nausea since Wednesday. Pt reports that she was seen at Urgent Care and was sent to [facility] but LWBS because the wait was too long. Pt states that Tmax at home was 101. Pt denies vomiting. Pt ambulatory to treatment room and is in NAD.

## 2021-02-12 NOTE — H&P (Signed)
Date of Admission:  02/12/2021  Reason for Admission:  Acute appendicitis  History of Present Illness: Danielle Goodman is a 25 y.o. female presenting with a 3 day history of RLQ abdominal pain.  She had presented to Urgent Care, [facility], and [facility] ER, but the wait times were too long at both ERs and then she presented today here to [facility].  She has had labs at each ER and she started with WBC of 13.8, and surprisingly it has been improving and is down to 8.0 today.  However, she had a CT scan today which showed acute appendicitis, with thickened wall at the base of the appendix with some periappendiceal stranding.  No evidence of perforation or abscess.  I have personally viewed the images and agree with the findings.  She had a low grade temp of 100.7 here and 101 at home.  Otherwise no chest pain, shortness of breath.  Reports nausea but no emesis.  Denies any constipation or blood in the stools.  Past Medical History: Past Medical History:  Diagnosis Date   ADHD (attention deficit hyperactivity disorder)    Asthma    Headache    Migrainse with pregnancty   SVD (spontaneous vaginal delivery) 03/17/2016     Past Surgical History: Past Surgical History:  Procedure Laterality Date   ADENOIDECTOMY     TONSILLECTOMY      Home Medications: Prior to Admission medications   Medication Sig Start Date End Date Taking? Authorizing Provider  methylphenidate (CONCERTA) 54 MG PO CR tablet Take 1 tablet by mouth daily.   Yes [provider]  acetaminophen (TYLENOL) 325 MG tablet Take 650 mg by mouth every 4 (four) hours as needed for headache.     [provider]  albuterol (VENTOLIN HFA) 108 (90 Base) MCG/ACT inhaler Inhale 2 puffs into the lungs every 4 (four) hours as needed for wheezing or shortness of breath. 06/21/19   Henderly, Britni A, PA-C  benzonatate (TESSALON) 100 MG capsule Take 1 capsule (100 mg total) by mouth every 8 (eight) hours. 06/21/19   Henderly,  Britni A, PA-C    Allergies: No Known Allergies  Social History:  reports that she has quit smoking. She has never used smokeless tobacco. She reports that she does not drink alcohol and does not use drugs.   Family History: Family History  Problem Relation Age of Onset   Hypertension Mother     Review of Systems: Review of Systems  Constitutional:  Positive for fever. Negative for chills.  HENT:  Negative for hearing loss.   Respiratory:  Negative for shortness of breath.   Cardiovascular:  Negative for chest pain.  Gastrointestinal:  Positive for abdominal pain and nausea. Negative for constipation, diarrhea and vomiting.  Genitourinary:  Negative for dysuria.  Musculoskeletal:  Negative for myalgias.  Skin:  Negative for rash.  Neurological:  Negative for dizziness.  Psychiatric/Behavioral:  Negative for depression.    Physical Exam BP 128/67 (BP Location: Right Arm)   Pulse 81   Temp 98.1 F (36.7 C) (Oral)   Resp 18   Ht 5' 1.5" (1.562 m)   Wt 78.5 kg   SpO2 100%   BMI 32.16 kg/m  CONSTITUTIONAL: No acute distress HEENT:  Normocephalic, atraumatic, extraocular motion intact. NECK: Trachea is midline, and there is no jugular venous distension.  RESPIRATORY:  Normal respiratory effort without pathologic use of accessory muscles. CARDIOVASCULAR: Regular rhythm and rate. GI: The abdomen is soft, non-distended, with tenderness in  the RLQ.  No diffuse peritonitis.  MUSCULOSKELETAL:  Normal muscle strength and tone in all four extremities.  No peripheral edema or cyanosis. SKIN: Skin turgor is normal. There are no pathologic skin lesions.  NEUROLOGIC:  Motor and sensation is grossly normal.  Cranial nerves are grossly intact. PSYCH:  Alert and oriented to person, place and time. Affect is normal.  Laboratory Analysis: Results for orders placed or performed during the hospital encounter of 02/12/21 (from the past 24 hour(s))  Comprehensive metabolic panel     Status:  None   Collection Time: 02/12/21  7:47 AM  Result Value Ref Range   Sodium 137 135 - 145 mmol/L   Potassium 4.1 3.5 - 5.1 mmol/L   Chloride 104 98 - 111 mmol/L   CO2 26 22 - 32 mmol/L   Glucose, Bld 99 70 - 99 mg/dL   BUN 8 6 - 20 mg/dL   Creatinine, Ser 4.09 0.44 - 1.00 mg/dL   Calcium 9.2 8.9 - 73.5 mg/dL   Total Protein 7.1 6.5 - 8.1 g/dL   Albumin 3.9 3.5 - 5.0 g/dL   AST 19 15 - 41 U/L   ALT 26 0 - 44 U/L   Alkaline Phosphatase 65 38 - 126 U/L   Total Bilirubin 0.7 0.3 - 1.2 mg/dL   GFR, Estimated >32 >99 mL/min   Anion gap 7 5 - 15  Lipase, blood     Status: None   Collection Time: 02/12/21  7:47 AM  Result Value Ref Range   Lipase 29 11 - 51 U/L  CBC with Differential     Status: Abnormal   Collection Time: 02/12/21  7:47 AM  Result Value Ref Range   WBC 8.0 4.0 - 10.5 K/uL   RBC 3.82 (L) 3.87 - 5.11 MIL/uL   Hemoglobin 11.6 (L) 12.0 - 15.0 g/dL   HCT 24.2 (L) 68.3 - 41.9 %   MCV 90.8 80.0 - 100.0 fL   MCH 30.4 26.0 - 34.0 pg   MCHC 33.4 30.0 - 36.0 g/dL   RDW 62.2 29.7 - 98.9 %   Platelets 378 150 - 400 K/uL   nRBC 0.0 0.0 - 0.2 %   Neutrophils Relative % 72 %   Neutro Abs 5.7 1.7 - 7.7 K/uL   Lymphocytes Relative 18 %   Lymphs Abs 1.5 0.7 - 4.0 K/uL   Monocytes Relative 8 %   Monocytes Absolute 0.6 0.1 - 1.0 K/uL   Eosinophils Relative 2 %   Eosinophils Absolute 0.2 0.0 - 0.5 K/uL   Basophils Relative 0 %   Basophils Absolute 0.0 0.0 - 0.1 K/uL   Immature Granulocytes 0 %   Abs Immature Granulocytes 0.02 0.00 - 0.07 K/uL  Urinalysis, Complete w Microscopic Urine, Clean Catch     Status: Abnormal   Collection Time: 02/12/21  8:09 AM  Result Value Ref Range   Color, Urine STRAW (A) YELLOW   APPearance CLEAR (A) CLEAR   Specific Gravity, Urine 1.011 1.005 - 1.030   pH 6.0 5.0 - 8.0   Glucose, UA NEGATIVE NEGATIVE mg/dL   Hgb urine dipstick MODERATE (A) NEGATIVE   Bilirubin Urine NEGATIVE NEGATIVE   Ketones, ur NEGATIVE NEGATIVE mg/dL   Protein, ur  NEGATIVE NEGATIVE mg/dL   Nitrite NEGATIVE NEGATIVE   Leukocytes,Ua NEGATIVE NEGATIVE   RBC / HPF 6-10 0 - 5 RBC/hpf   WBC, UA 0-5 0 - 5 WBC/hpf   Bacteria, UA RARE (A) NONE SEEN   Squamous Epithelial /  LPF 0-5 0 - 5  Pregnancy, urine     Status: None   Collection Time: 02/12/21  8:09 AM  Result Value Ref Range   Preg Test, Ur NEGATIVE NEGATIVE    Imaging: CT ABDOMEN PELVIS W CONTRAST  Result Date: 02/12/2021 CLINICAL DATA:  Right lower quadrant pain with fever EXAM: CT ABDOMEN AND PELVIS WITH CONTRAST TECHNIQUE: Multidetector CT imaging of the abdomen and pelvis was performed using the standard protocol following bolus administration of intravenous contrast. CONTRAST:  60mL OMNIPAQUE IOHEXOL 350 MG/ML SOLN COMPARISON:  CT abdomen/pelvis 06/08/2018 FINDINGS: Lower chest: The lung bases are clear. The imaged heart is unremarkable. Hepatobiliary: The liver and gallbladder are unremarkable. There is no biliary ductal dilatation. Pancreas: Unremarkable. Spleen: Unremarkable. Adrenals/Urinary Tract: The adrenals are unremarkable. A hypodense lesion in the right kidney is too small to characterize but likely reflects a small cyst. A tiny hypodense lesion in the left kidney upper pole also likely reflects a small cyst. There are no other focal lesions. There are no stones. There is no hydronephrosis or hydroureter. The bladder is unremarkable. Stomach/Bowel: The stomach is unremarkable there is no evidence of bowel obstruction. The appendix is fluid-filled and dilated at the base measuring up to 1.1 cm. The appendiceal wall is mildly thickened. There is minimal inflammatory change in the surrounding fat. There is no evidence of perforation or abscess formation. Vascular/Lymphatic: The abdominal aorta is nonaneurysmal. The major branch vessels are patent. The main portal and splenic veins are patent. There is no abdominal or pelvic lymphadenopathy. Reproductive: There is a 3.0 cm left adnexal cyst. The  uterus and right adnexa are unremarkable. Other: There is no ascites or free air. A small fat containing umbilical hernia is unchanged. Musculoskeletal: There is no acute osseous abnormality. IMPRESSION: Findings above suspicious for early/mild acute uncomplicated appendicitis. Correlate with physical exam and laboratory values. Electronically Signed   By: Lesia Hausen M.D.   On: 02/12/2021 10:34    Assessment and Plan: This is a 25 y.o. female with acute appendicitis  --Discussed with laboratory findings and CT scan findings with the patient consistent with appendicitis.  Discussed the options for antibiotic treatment with conservative measures vs surgical intervention with laparoscopic appendectomy.  She has decided to proceed with surgery.  Discussed the surgery at length, including risks of bleeding, infection, injury to surrounding structures, post-op recovery and discharge, and she's willing to proceed. --Will take her today for laparoscopic appendectomy pending anesthesia team availability.  Face-to-face time spent with the patient and care providers was 70 minutes, with more than 50% of the time spent counseling, educating, and coordinating care of the patient.     Howie Ill, MD [facility] Pg:  [phone]

## 2021-02-12 NOTE — ED Notes (Signed)
Called pt. No response.  

## 2021-02-12 NOTE — Anesthesia Preprocedure Evaluation (Signed)
Anesthesia Evaluation  Patient identified by MRN, date of birth, ID band Patient awake    Reviewed: Allergy & Precautions, NPO status , Patient's Chart, lab work & pertinent test results  History of Anesthesia Complications Negative for: history of anesthetic complications  Airway Mallampati: II  TM Distance: >3 FB Neck ROM: Full    Dental no notable dental hx.    Pulmonary asthma (mild intermittent) , Current Smoker (vapes) and Patient abstained from smoking., former smoker,    breath sounds clear to auscultation- rhonchi (-) wheezing      Cardiovascular Exercise Tolerance: Good (-) hypertension(-) CAD, (-) Past MI, (-) Cardiac Stents and (-) CABG  Rhythm:Regular Rate:Normal - Systolic murmurs and - Diastolic murmurs    Neuro/Psych  Headaches, neg Seizures PSYCHIATRIC DISORDERS (ADHD)    GI/Hepatic Neg liver ROS, Acute appendicitis    Endo/Other  negative endocrine ROSneg diabetes  Renal/GU negative Renal ROS     Musculoskeletal negative musculoskeletal ROS (+)   Abdominal (+) + obese,   Peds  Hematology negative hematology ROS (+)   Anesthesia Other Findings Past Medical History: No date: ADHD (attention deficit hyperactivity disorder) No date: Asthma No date: Headache     Comment:  Migrainse with pregnancty 03/17/2016: SVD (spontaneous vaginal delivery)   Reproductive/Obstetrics                             Anesthesia Physical Anesthesia Plan  ASA: 2  Anesthesia Plan: General   Post-op Pain Management:    Induction: Intravenous  PONV Risk Score and Plan: 2 and Ondansetron, Dexamethasone and Midazolam  Airway Management Planned: Oral ETT  Additional Equipment:   Intra-op Plan:   Post-operative Plan: Extubation in OR  Informed Consent: I have reviewed the patients History and Physical, chart, labs and discussed the procedure including the risks, benefits and alternatives  for the proposed anesthesia with the patient or authorized representative who has indicated his/her understanding and acceptance.     Dental advisory given  Plan Discussed with: CRNA and Anesthesiologist  Anesthesia Plan Comments:         Anesthesia Quick Evaluation

## 2021-02-12 NOTE — Progress Notes (Signed)
Patient admitted to unit from PACU at 70. Vitals stable. Patient complains of pain 5/10 but refuses pain med at this time. Incision sites WDL, no drainage. Patient up to void ( ). Patient requesting to walk in hall but has slight dizziness. RN encouraged patient to rest for now and let dizziness subside before walking in the hall. Incentive spirometer explained to patient. Patient has a lot of questions and nervousness about recovery process. RN explained recovery to patient. RN instructed patient to ask MD about length of time out of work during rounds tomorrow. Patient verbalizes understanding.  Patient states she has asthma and is requesting an albuterol inhaler as needed. Patient also requesting oxycodone, as she took this in PACU and she does not like to take IV pain medication. RN spoke with patient about a nicotine patch because she is a smoker. Patient also asking for medication for anxiety. Provider, Dr. Aleen Campi, alerted and medications ordered. Patient then refused all meds and will let RN know when she is ready to take them.

## 2021-02-12 NOTE — ED Notes (Signed)
Patient advised not to leave, LWBS.  

## 2021-02-12 NOTE — ED Provider Notes (Signed)
[facility] Emergency Department Provider Note   ____________________________________________   Event Date/Time   First MD Initiated Contact with Patient 02/12/21 [phone]     (approximate)  I have reviewed the triage vital signs and the nursing notes.   HISTORY  Chief Complaint Abdominal Pain and Fever    HPI Danielle Goodman is a 25 y.o. female who reports 3 days of right lower quadrant pain with fever.  She was worried about appendicitis and went to urgent care and was sent to [facility] but she reports after 10 hours of waiting [place], she left and came here this morning.  She has some nausea this been going on since beginning of this.  Is not had any vomiting temperature was up to 103 at home.  Is now 30 here.  Pain is worse with walking or palpation.  It does not radiate.  Is been fairly constant since onset.  Patient does not have any vaginal discharge or worried about any vaginal infection.        Past Medical History:  Diagnosis Date   ADHD (attention deficit hyperactivity disorder)    Asthma    Headache    Migrainse with pregnancty   SVD (spontaneous vaginal delivery) 03/17/2016    Patient Active Problem List   Diagnosis Date Noted   Normal labor and delivery 03/17/2016   SVD (spontaneous vaginal delivery) 03/17/2016   Premature rupture of membranes 07/10/2013    Past Surgical History:  Procedure Laterality Date   ADENOIDECTOMY     TONSILLECTOMY      Prior to Admission medications   Medication Sig Start Date End Date Taking? Authorizing Provider  acetaminophen (TYLENOL) 325 MG tablet Take 650 mg by mouth every 4 (four) hours as needed for headache.     [provider]  albuterol (VENTOLIN HFA) 108 (90 Base) MCG/ACT inhaler Inhale 2 puffs into the lungs every 4 (four) hours as needed for wheezing or shortness of breath. 06/21/19   Henderly, Britni A, PA-C  benzonatate (TESSALON) 100 MG capsule Take 1 capsule (100 mg total) by  mouth every 8 (eight) hours. 06/21/19   Henderly, Britni A, PA-C    Allergies Patient has no known allergies.  Family History  Problem Relation Age of Onset   Hypertension Mother     Social History Social History   Tobacco Use   Smoking status: Former   Smokeless tobacco: Never  Building services engineer Use: Every day  Substance Use Topics   Alcohol use: No   Drug use: No    Review of Systems  Constitutional: No fever/chills Eyes: No visual changes. ENT: No sore throat. Cardiovascular: Denies chest pain. Respiratory: Denies shortness of breath. Gastrointestinal:  abdominal pain.  nausea, no vomiting.  No diarrhea.  No constipation. Genitourinary: Negative for dysuria. Musculoskeletal: Negative for back pain. Skin: Negative for rash. Neurological: Negative for headaches, focal weakness   ____________________________________________   PHYSICAL EXAM:  VITAL SIGNS: ED Triage Vitals  Enc Vitals Group     BP 02/12/21 0716 126/77     Pulse --      Resp 02/12/21 0716 16     Temp 02/12/21 0716 98.1 F (36.7 C)     Temp Source 02/12/21 0716 Oral     SpO2 02/12/21 0722 100 %     Weight 02/12/21 0714 173 lb (78.5 kg)     Height 02/12/21 0714 5' 1.5" (1.562 m)     Head Circumference --  Peak Flow --      Pain Score 02/12/21 0714 7     Pain Loc --      Pain Edu? --      Excl. in GC? --     Constitutional: Alert and oriented. Well appearing and in no acute distress. Eyes: Conjunctivae are normal. PER Head: Atraumatic. Nose: No congestion/rhinnorhea. Mouth/Throat: Mucous membranes are moist.  Oropharynx non-erythematous. Neck: No stridor. Cardiovascular: Normal rate, regular rhythm. Grossly normal heart sounds.  Good peripheral circulation. Respiratory: Normal respiratory effort.  No retractions. Lungs CTAB. Gastrointestinal: Soft tender to palpation and percussion of the right lower quadrant otherwise not tender.  Pressing and releasing on the left side of the  abdomen also causes pain on the right side the abdomen.  No distention. No abdominal bruits. No CVA tenderness. Musculoskeletal: No lower extremity tenderness nor edema.  No joint effusions. Neurologic:  Normal speech and language. No gross focal neurologic deficits are appreciated.  Skin:  Skin is warm, dry and intact. No rash noted.  ____________________________________________   LABS (all labs ordered are listed, but only abnormal results are displayed)  Labs Reviewed  CBC WITH DIFFERENTIAL/PLATELET - Abnormal; Notable for the following components:      Result Value   RBC 3.82 (*)    Hemoglobin 11.6 (*)    HCT 34.7 (*)    All other components within normal limits  URINALYSIS, COMPLETE (UACMP) WITH MICROSCOPIC - Abnormal; Notable for the following components:   Color, Urine STRAW (*)    APPearance CLEAR (*)    Hgb urine dipstick MODERATE (*)    Bacteria, UA RARE (*)    All other components within normal limits  RESP PANEL BY RT-PCR (FLU A&B, COVID) ARPGX2  COMPREHENSIVE METABOLIC PANEL  LIPASE, BLOOD  PREGNANCY, URINE   ____________________________________________  EKG   ____________________________________________  RADIOLOGY Jill Poling, personally viewed and evaluated these images (plain radiographs) as part of my medical decision making, as well as reviewing the written report by the radiologist.  ED MD interpretation: CT read by radiology reviewed by me and again reviewed with the surgeon a second time shows early acute appendicitis.  Official radiology report(s): CT ABDOMEN PELVIS W CONTRAST  Result Date: 02/12/2021 CLINICAL DATA:  Right lower quadrant pain with fever EXAM: CT ABDOMEN AND PELVIS WITH CONTRAST TECHNIQUE: Multidetector CT imaging of the abdomen and pelvis was performed using the standard protocol following bolus administration of intravenous contrast. CONTRAST:  7mL OMNIPAQUE IOHEXOL 350 MG/ML SOLN COMPARISON:  CT abdomen/pelvis 06/08/2018  FINDINGS: Lower chest: The lung bases are clear. The imaged heart is unremarkable. Hepatobiliary: The liver and gallbladder are unremarkable. There is no biliary ductal dilatation. Pancreas: Unremarkable. Spleen: Unremarkable. Adrenals/Urinary Tract: The adrenals are unremarkable. A hypodense lesion in the right kidney is too small to characterize but likely reflects a small cyst. A tiny hypodense lesion in the left kidney upper pole also likely reflects a small cyst. There are no other focal lesions. There are no stones. There is no hydronephrosis or hydroureter. The bladder is unremarkable. Stomach/Bowel: The stomach is unremarkable there is no evidence of bowel obstruction. The appendix is fluid-filled and dilated at the base measuring up to 1.1 cm. The appendiceal wall is mildly thickened. There is minimal inflammatory change in the surrounding fat. There is no evidence of perforation or abscess formation. Vascular/Lymphatic: The abdominal aorta is nonaneurysmal. The major branch vessels are patent. The main portal and splenic veins are patent. There is no abdominal or  pelvic lymphadenopathy. Reproductive: There is a 3.0 cm left adnexal cyst. The uterus and right adnexa are unremarkable. Other: There is no ascites or free air. A small fat containing umbilical hernia is unchanged. Musculoskeletal: There is no acute osseous abnormality. IMPRESSION: Findings above suspicious for early/mild acute uncomplicated appendicitis. Correlate with physical exam and laboratory values. Electronically Signed   By: Lesia Hausen M.D.   On: 02/12/2021 10:34    ____________________________________________   PROCEDURES  Procedure(s) performed (including Critical Care):  Procedures   ____________________________________________   INITIAL IMPRESSION / ASSESSMENT AND PLAN / ED COURSE Patient does not want any pain medicine at this time.  Discussed results of CT scan with patient.  Reviewed the possibility of using  antibiotics to treat this with her.  She says she would rather just get it done and have a surgery.  I discussed her with the surgeon who will come and see her.  He has 1 more patient to finish in clinic and then he will be here.  Patient is doing well and is stable at this time.               ____________________________________________   FINAL CLINICAL IMPRESSION(S) / ED DIAGNOSES  Final diagnoses:  Acute appendicitis, unspecified acute appendicitis type     ED Discharge Orders     None        Note:  This document was prepared using Dragon voice recognition software and may include unintentional dictation errors.    Arnaldo Natal, MD 02/12/21 1100

## 2021-02-12 NOTE — Transfer of Care (Signed)
Immediate Anesthesia Transfer of Care Note  Patient: Danielle Goodman  Procedure(s) Performed: APPENDECTOMY LAPAROSCOPIC  Patient Location: PACU  Anesthesia Type:General  Level of Consciousness: sedated  Airway & Oxygen Therapy: Patient Spontanous Breathing and Patient connected to face mask oxygen  Post-op Assessment: Report given to RN and Post -op Vital signs reviewed and stable  Post vital signs: Reviewed and stable  Last Vitals:  Vitals Value Taken Time  BP    Temp    Pulse 71 02/12/21 1817  Resp 16 02/12/21 1817  SpO2 100 % 02/12/21 1817  Vitals shown include unvalidated device data.  Last Pain:  Vitals:   02/12/21 1340  TempSrc: Oral  PainSc: 3          Complications: No notable events documented.

## 2021-02-12 NOTE — Anesthesia Procedure Notes (Signed)
Procedure Name: Intubation Date/Time: 02/12/2021 5:14 PM Performed by: Nelda Marseille, CRNA Pre-anesthesia Checklist: Patient identified, Patient being monitored, Timeout performed, Emergency Drugs available and Suction available Patient Re-evaluated:Patient Re-evaluated prior to induction Oxygen Delivery Method: Circle system utilized Preoxygenation: Pre-oxygenation with 100% oxygen Induction Type: IV induction Ventilation: Mask ventilation without difficulty Laryngoscope Size: Mac, 3 and McGraph Grade View: Grade I Tube type: Oral Tube size: 7.0 mm Number of attempts: 1 Airway Equipment and Method: Stylet Placement Confirmation: ETT inserted through vocal cords under direct vision, positive ETCO2 and breath sounds checked- equal and bilateral Secured at: 21 cm Tube secured with: Tape Dental Injury: Teeth and Oropharynx as per pre-operative assessment

## 2021-02-12 NOTE — Op Note (Signed)
  Procedure Date:  02/12/2021  Pre-operative Diagnosis:  Acute appendicitis  Post-operative Diagnosis: Acute appendicitis  Procedure:  Laparoscopic appendectomy  Surgeon:  Howie Ill, MD  Assistant:  Stephan Minister, PA-S  Anesthesia:  General endotracheal  Estimated Blood Loss:  5 ml  Specimens:  appendix  Complications:  None  Indications for Procedure:  This is a 25 y.o. female who presents with abdominal pain and workup revealing acute appendicitis.  The options of surgery versus observation were reviewed with the patient and/or family. The risks of bleeding, infection, recurrence of symptoms, negative laparoscopy, potential for an open procedure, bowel injury, abscess or infection, were all discussed with the patient and she was willing to proceed.  Description of Procedure: The patient was correctly identified in the preoperative area and brought into the operating room.  The patient was placed supine with VTE prophylaxis in place.  Appropriate time-outs were performed.  Anesthesia was induced and the patient was intubated.  Foley catheter was placed.  Appropriate antibiotics were infused.  The abdomen was prepped and draped in a sterile fashion. An infraumbilical incision was made. A cutdown technique was used to enter the abdominal cavity without injury, and a Hasson trocar was inserted.  Pneumoperitoneum was obtained with appropriate opening pressures.  Two 5-mm ports were placed in the suprapubic and left lateral positions under direct visualization.  The right lower quadrant was inspected and the appendix was identified and found to be acutely inflamed at the base, without any evidence of perforation.  The appendix was carefully dissected.  The mesoappendix was divided using the LigaSure.  The base of the appendix was dissected out and divided with a standard load Endo GIA.  The appendix was placed in an Endocatch bag.  The right lower quadrant was then inspected again revealing  an intact staple line, no bleeding, and no bowel injury.  The 5 mm ports were removed under direct visualization and the Hasson trocar was removed.  The Endocatch bag was brought out through the umbilical incision.  The fascial opening was closed using 0 vicryl suture.  Local anesthetic was infused in all incisions and the incisions were closed with 4-0 Monocryl.  The wounds were cleaned and sealed with DermaBond.  Foley catheter was removed and the patient was emerged from anesthesia and extubated and brought to the recovery room for further management.  The patient tolerated the procedure well and all counts were correct at the end of the case.   Howie Ill, MD

## 2021-02-12 NOTE — ED Triage Notes (Signed)
First Nurse Note:  C/O RLQ pain x 3 days.  Patient has presented to [facility] and [facility] but LWBS after triage due to wait times.  AAOx3.  Skin warm and dry. NAD

## 2021-02-13 LAB — HIV ANTIBODY (ROUTINE TESTING W REFLEX): HIV Screen 4th Generation wRfx: NONREACTIVE

## 2021-02-13 MED ORDER — OXYCODONE HCL 5 MG PO TABS: 5.0000 mg | ORAL_TABLET | Freq: Four times a day (QID) | ORAL | 0 refills | Status: DC | PRN

## 2021-02-13 MED ORDER — IBUPROFEN 800 MG PO TABS: 800.0000 mg | ORAL_TABLET | Freq: Three times a day (TID) | ORAL | 1 refills | Status: DC | PRN

## 2021-02-13 NOTE — Discharge Summary (Signed)
Patient ID: Danielle Goodman MRN: [id number] DOB/AGE: 10/19/1995 24 y.o.  Admit date: 02/12/2021 Discharge date: 02/13/2021   Discharge Diagnoses:  Active Problems:   Acute appendicitis   Procedures:  Laparoscopic appendectomy  Hospital Course: Patient was admitted on 02/12/21 with acute appendicitis and taken to the OR same day for laparoscopic appendectomy.  Post-operatively, the patient has done well, without any worsening pain and is tolerating diet and ambulating.  On exam, she's in no acute distress, with stable vital signs.  Her abdomen is soft, non-distended, appropriately tender to palpation.  Incisions clean, dry, intact.  She will follow up in two weeks.  Consults: None  Disposition: Discharge disposition: 01-Home or Self Care      Discharge Instructions     Call MD for:  difficulty breathing, headache or visual disturbances   Complete by: As directed    Call MD for:  persistant nausea and vomiting   Complete by: As directed    Call MD for:  redness, tenderness, or signs of infection (pain, swelling, redness, odor or green/yellow discharge around incision site)   Complete by: As directed    Call MD for:  severe uncontrolled pain   Complete by: As directed    Call MD for:  temperature >100.4   Complete by: As directed    Diet - low sodium heart healthy   Complete by: As directed    Discharge instructions   Complete by: As directed    1.  Patient may shower, but do not scrub wounds heavily and dab dry only. 2.  Do not submerge wounds in pool/tub for 1 week. 3.  Do not apply ointments or hydrogen peroxide to the wounds. 4.  May apply ice packs to the wounds for comfort.   Driving Restrictions   Complete by: As directed    Do not drive while taking narcotics for pain control.   Increase activity slowly   Complete by: As directed    Lifting restrictions   Complete by: As directed    No heavy lifting or pushing of more than 10-15 lbs for 4 weeks.   No dressing  needed   Complete by: As directed       Allergies as of 02/13/2021   No Known Allergies      Medication List     TAKE these medications    acetaminophen 325 MG tablet Commonly known as: TYLENOL Take 650 mg by mouth every 4 (four) hours as needed for headache.   albuterol 108 (90 Base) MCG/ACT inhaler Commonly known as: VENTOLIN HFA Inhale 2 puffs into the lungs every 4 (four) hours as needed for wheezing or shortness of breath.   docusate sodium 100 MG capsule Commonly known as: COLACE Take 100 mg by mouth as needed for mild constipation.   ibuprofen 200 MG tablet Commonly known as: ADVIL Take 400 mg by mouth daily as needed. What changed: Another medication with the same name was added. Make sure you understand how and when to take each.   ibuprofen 800 MG tablet Commonly known as: ADVIL Take 1 tablet (800 mg total) by mouth every 8 (eight) hours as needed. What changed: You were already taking a medication with the same name, and this prescription was added. Make sure you understand how and when to take each.   oxyCODONE 5 MG immediate release tablet Commonly known as: Oxy IR/ROXICODONE Take 1 tablet (5 mg total) by mouth every 6 (six) hours as needed for severe pain.  Discharge Care Instructions  (From admission, onward)           Start     Ordered   02/13/21 0000  No dressing needed        02/13/21 7616            Follow-up Information     Donovan Kail, PA-C Follow up in 2 week(s).   Specialty: Physician Assistant Contact information: [address] [place] [address] [phone]

## 2021-02-13 NOTE — Progress Notes (Signed)
Pt given d/c handout, reviewed with pt by nurse. Pt verbalized understanding. Escorted out in a wheelchair by axiliary.     °

## 2021-02-13 NOTE — Anesthesia Postprocedure Evaluation (Signed)
Anesthesia Post Note  Patient: Danielle Goodman  Procedure(s) Performed: APPENDECTOMY LAPAROSCOPIC  Patient location during evaluation: PACU Anesthesia Type: General Level of consciousness: awake and alert Pain management: pain level controlled Vital Signs Assessment: post-procedure vital signs reviewed and stable Respiratory status: spontaneous breathing, nonlabored ventilation, respiratory function stable and patient connected to nasal cannula oxygen Cardiovascular status: blood pressure returned to baseline and stable Postop Assessment: no apparent nausea or vomiting Anesthetic complications: no   No notable events documented.   Last Vitals:  Vitals:   02/13/21 0007 02/13/21 0438  BP: 129/73 127/80  Pulse: 78 71  Resp: 18 17  Temp: 36.6 C 36.5 C  SpO2: 100% 100%    Last Pain:  Vitals:   02/13/21 0619  TempSrc:   PainSc: Asleep                 Lenard Simmer

## 2021-02-14 ENCOUNTER — Encounter: Payer: Self-pay | Admitting: Surgery

## 2021-02-16 LAB — SURGICAL PATHOLOGY

## 2021-04-05 ENCOUNTER — Emergency Department (HOSPITAL_COMMUNITY)
Admission: EM | Admit: 2021-04-05 | Discharge: 2021-04-05 | Disposition: A | Discharge status: Home / Self Care | Payer: Medicaid Other | Attending: Emergency Medicine | Admitting: Emergency Medicine

## 2021-04-05 ENCOUNTER — Encounter (HOSPITAL_COMMUNITY): Payer: Self-pay

## 2021-04-05 ENCOUNTER — Other Ambulatory Visit: Payer: Self-pay

## 2021-04-05 DIAGNOSIS — F10129 Alcohol abuse with intoxication, unspecified: Secondary | ICD-10-CM | POA: Insufficient documentation

## 2021-04-05 DIAGNOSIS — Z5321 Procedure and treatment not carried out due to patient leaving prior to being seen by health care provider: Secondary | ICD-10-CM | POA: Insufficient documentation

## 2021-04-05 NOTE — ED Triage Notes (Signed)
Pt BIB EMS ETOH, pt was in her car sleep.  22 g right hand.

## 2021-06-03 ENCOUNTER — Encounter (HOSPITAL_COMMUNITY): Payer: Self-pay | Admitting: Emergency Medicine

## 2021-06-03 ENCOUNTER — Other Ambulatory Visit: Payer: Self-pay

## 2021-06-03 ENCOUNTER — Ambulatory Visit (HOSPITAL_COMMUNITY)
Admission: EM | Admit: 2021-06-03 | Discharge: 2021-06-03 | Disposition: A | Discharge status: Home / Self Care | Payer: Medicaid Other | Attending: Family Medicine | Admitting: Family Medicine

## 2021-06-03 DIAGNOSIS — G43109 Migraine with aura, not intractable, without status migrainosus: Secondary | ICD-10-CM

## 2021-06-03 DIAGNOSIS — H539 Unspecified visual disturbance: Secondary | ICD-10-CM | POA: Diagnosis not present

## 2021-06-03 LAB — POC URINE PREG, ED: Preg Test, Ur: NEGATIVE

## 2021-06-03 MED ORDER — RIZATRIPTAN BENZOATE 10 MG PO TBDP: 10.0000 mg | ORAL_TABLET | ORAL | 0 refills | Status: DC | PRN

## 2021-06-03 NOTE — ED Triage Notes (Signed)
Pt reports that will have episodes of seeing lights and getting headaches that were very rarely but past 3 days have been more consistent happening about 4 times a day.

## 2021-06-03 NOTE — Discharge Instructions (Addendum)
You were seen today for vision changes and headache.  These sound like atypical migraine headaches. Your pregnancy test was negative.   I have sent out maxalt to use as needed for headache.  Please use at the start of the headache.  You may only use 1, and repeat in 2 hrs if needed.  No more than 2 tabs/24 hrs. .  If needed, you may use over the counter excedrin migraine as well.  Please follow up with your primary care provider for further discussion about your migraines.

## 2021-06-03 NOTE — ED Provider Notes (Signed)
[place]    CSN: [id number] Arrival date & time: 06/03/21  0841      History   Chief Complaint Chief Complaint  Patient presents with   Eye Problem   Headache    HPI Danielle Goodman is a 26 y.o. female.   Patient is here for weird flashing lights in her eyes the last few days.  Gets them about 3-4 times/day, lasts about 20 mins, and then bad headache.  The headache never really goes away.  She had these issues in the past while pregnant.  Last year was once every 2-3 months, but has been bad the last 2-3 days.  She is taking motrin without much else.  No n/v, but having dizziness.  No blurry vision  otherwise.  She does have areas of "blacked out vision" after the flashing lights, but this resolves.  She has the nexplanon and does not think she is pregnant.   Past Medical History:  Diagnosis Date   ADHD (attention deficit hyperactivity disorder)    Asthma    Headache    Migrainse with pregnancty   SVD (spontaneous vaginal delivery) 03/17/2016    Patient Active Problem List   Diagnosis Date Noted   Acute appendicitis 02/12/2021   Normal labor and delivery 03/17/2016   SVD (spontaneous vaginal delivery) 03/17/2016   Premature rupture of membranes 07/10/2013    Past Surgical History:  Procedure Laterality Date   ADENOIDECTOMY     LAPAROSCOPIC APPENDECTOMY N/A 02/12/2021   Procedure: APPENDECTOMY LAPAROSCOPIC;  Surgeon: Olean Ree, MD;  Location: [facility] ORS;  Service: General;  Laterality: N/A;   TONSILLECTOMY      OB History     Gravida  2   Para  2   Term  2   Preterm      AB      Living  2      SAB      IAB      Ectopic      Multiple  0   Live Births  2            Home Medications    Prior to Admission medications   Medication Sig Start Date End Date Taking? Authorizing Provider  acetaminophen (TYLENOL) 325 MG tablet Take 650 mg by mouth every 4 (four) hours as needed for headache.     [provider]   albuterol (VENTOLIN HFA) 108 (90 Base) MCG/ACT inhaler Inhale 2 puffs into the lungs every 4 (four) hours as needed for wheezing or shortness of breath. Patient not taking: No sig reported 06/21/19   Henderly, Britni A, PA-C  docusate sodium (COLACE) 100 MG capsule Take 100 mg by mouth as needed for mild constipation.    [provider]  ibuprofen (ADVIL) 200 MG tablet Take 400 mg by mouth daily as needed.    [provider]  ibuprofen (ADVIL) 800 MG tablet Take 1 tablet (800 mg total) by mouth every 8 (eight) hours as needed. 02/13/21   Olean Ree, MD  oxyCODONE (OXY IR/ROXICODONE) 5 MG immediate release tablet Take 1 tablet (5 mg total) by mouth every 6 (six) hours as needed for severe pain. 02/13/21   Olean Ree, MD    Family History Family History  Problem Relation Age of Onset   Hypertension Mother     Social History Social History   Tobacco Use   Smoking status: Former   Smokeless tobacco: Never  Scientific laboratory technician Use: Every day  Substance Use Topics   Alcohol use: No   Drug use: No     Allergies   Patient has no known allergies.   Review of Systems Review of Systems  Constitutional: Negative.   HENT: Negative.    Eyes:  Positive for photophobia and visual disturbance.  Respiratory: Negative.    Cardiovascular: Negative.   Gastrointestinal: Negative.   Genitourinary: Negative.   Neurological:  Positive for headaches.    Physical Exam Triage Vital Signs ED Triage Vitals  Enc Vitals Group     BP 06/03/21 0909 126/84     Pulse Rate 06/03/21 0909 77     Resp 06/03/21 0909 17     Temp 06/03/21 0909 98.7 F (37.1 C)     Temp Source 06/03/21 0909 Oral     SpO2 06/03/21 0909 98 %     Weight --      Height --      Head Circumference --      Peak Flow --      Pain Score 06/03/21 0907 0     Pain Loc --      Pain Edu? --      Excl. in [place]? --    No data found.  Updated Vital Signs BP 126/84 (BP Location: Left Arm)    Pulse 77     Temp 98.7 F (37.1 C) (Oral)    Resp 17    SpO2 98%   Visual Acuity Right Eye Distance:   Left Eye Distance:   Bilateral Distance:    Right Eye Near:   Left Eye Near:    Bilateral Near:     Physical Exam Constitutional:      Appearance: She is well-developed.  HENT:     Head: Normocephalic and atraumatic.  Eyes:     General: No scleral icterus.    Extraocular Movements: Extraocular movements intact.     Pupils: Pupils are equal, round, and reactive to light. Pupils are equal.  Cardiovascular:     Rate and Rhythm: Normal rate and regular rhythm.     Heart sounds: Normal heart sounds.  Pulmonary:     Effort: Pulmonary effort is normal.     Breath sounds: Normal breath sounds.  Musculoskeletal:     Cervical back: Normal range of motion and neck supple.  Neurological:     Mental Status: She is alert.     UC Treatments / Results  Labs (all labs ordered are listed, but only abnormal results are displayed) Labs Reviewed  POC URINE PREG, ED    EKG   Radiology No results found.  Procedures Procedures (including critical care time)  Medications Ordered in UC Medications - No data to display  Initial Impression / Assessment and Plan / UC Course  I have reviewed the triage vital signs and the nursing notes.  Pertinent labs & imaging results that were available during my care of the patient were reviewed by me and considered in my medical decision making (see chart for details).   Patient seen for vision changes and headache.  History of migraines, and this is consistent with atypical migraines.  Will trial maxalt for acute headache.  Advised to follow up with her pcp for further discussion and treatment for her recurrent headaches.   Final Clinical Impressions(s) / UC Diagnoses   Final diagnoses:  Migraine with aura and without status migrainosus, not intractable  Vision changes     Discharge Instructions      You were seen today  for vision changes and  headache.  These sound like atypical migraine headaches. Your pregnancy test was negative.   I have sent out maxalt to use as needed for headache.  Please use at the start of the headache.  You may only use 1, and repeat in 2 hrs if needed.  No more than 2 tabs/24 hrs. .  If needed, you may use over the counter excedrin migraine as well.  Please follow up with your primary care provider for further discussion about your migraines.     ED Prescriptions     Medication Sig Dispense Auth. Provider   rizatriptan (MAXALT-MLT) 10 MG disintegrating tablet Take 1 tablet (10 mg total) by mouth as needed for migraine. May repeat in 2 hours if needed 10 tablet Rondel Oh, MD      PDMP not reviewed this encounter.   Rondel Oh, MD 06/03/21 1003

## 2021-06-06 ENCOUNTER — Emergency Department
Admission: EM | Admit: 2021-06-06 | Discharge: 2021-06-07 | Disposition: A | Discharge status: Home / Self Care | Payer: Medicaid Other | Attending: Emergency Medicine | Admitting: Emergency Medicine

## 2021-06-06 ENCOUNTER — Other Ambulatory Visit: Payer: Self-pay

## 2021-06-06 DIAGNOSIS — R000 Tachycardia, unspecified: Secondary | ICD-10-CM | POA: Diagnosis not present

## 2021-06-06 DIAGNOSIS — Z20822 Contact with and (suspected) exposure to covid-19: Secondary | ICD-10-CM | POA: Diagnosis not present

## 2021-06-06 DIAGNOSIS — F32A Depression, unspecified: Secondary | ICD-10-CM | POA: Diagnosis not present

## 2021-06-06 DIAGNOSIS — Z87891 Personal history of nicotine dependence: Secondary | ICD-10-CM | POA: Diagnosis not present

## 2021-06-06 DIAGNOSIS — Z79899 Other long term (current) drug therapy: Secondary | ICD-10-CM | POA: Insufficient documentation

## 2021-06-06 DIAGNOSIS — Z046 Encounter for general psychiatric examination, requested by authority: Secondary | ICD-10-CM | POA: Insufficient documentation

## 2021-06-06 DIAGNOSIS — F10120 Alcohol abuse with intoxication, uncomplicated: Secondary | ICD-10-CM | POA: Diagnosis not present

## 2021-06-06 DIAGNOSIS — X789XXA Intentional self-harm by unspecified sharp object, initial encounter: Secondary | ICD-10-CM | POA: Insufficient documentation

## 2021-06-06 DIAGNOSIS — S6992XA Unspecified injury of left wrist, hand and finger(s), initial encounter: Secondary | ICD-10-CM | POA: Diagnosis present

## 2021-06-06 DIAGNOSIS — F10920 Alcohol use, unspecified with intoxication, uncomplicated: Secondary | ICD-10-CM | POA: Diagnosis not present

## 2021-06-06 DIAGNOSIS — F10929 Alcohol use, unspecified with intoxication, unspecified: Secondary | ICD-10-CM

## 2021-06-06 DIAGNOSIS — S61512A Laceration without foreign body of left wrist, initial encounter: Secondary | ICD-10-CM | POA: Diagnosis not present

## 2021-06-06 DIAGNOSIS — Y908 Blood alcohol level of 240 mg/100 ml or more: Secondary | ICD-10-CM | POA: Insufficient documentation

## 2021-06-06 LAB — SALICYLATE LEVEL: Salicylate Lvl: <7 mg/dL — ABNORMAL LOW (ref 7.0–30.0)

## 2021-06-06 LAB — URINE DRUG SCREEN, QUALITATIVE (ARMC ONLY)
Amphetamines, Ur Screen: NOT DETECTED
Barbiturates, Ur Screen: NOT DETECTED
Benzodiazepine, Ur Scrn: NOT DETECTED
Cannabinoid 50 Ng, Ur ~~LOC~~: NOT DETECTED
Cocaine Metabolite,Ur ~~LOC~~: NOT DETECTED
MDMA (Ecstasy)Ur Screen: NOT DETECTED
Methadone Scn, Ur: NOT DETECTED
Opiate, Ur Screen: NOT DETECTED
Phencyclidine (PCP) Ur S: NOT DETECTED
Tricyclic, Ur Screen: NOT DETECTED

## 2021-06-06 LAB — CBC
HCT: 38.2 % (ref 36.0–46.0)
Hemoglobin: 12.9 g/dL (ref 12.0–15.0)
MCH: 30.4 pg (ref 26.0–34.0)
MCHC: 33.8 g/dL (ref 30.0–36.0)
MCV: 90.1 fL (ref 80.0–100.0)
Platelets: 409 10*3/uL — ABNORMAL HIGH (ref 150–400)
RBC: 4.24 MIL/uL (ref 3.87–5.11)
RDW: 12.5 % (ref 11.5–15.5)
WBC: 10.7 10*3/uL — ABNORMAL HIGH (ref 4.0–10.5)
nRBC: 0 % (ref 0.0–0.2)

## 2021-06-06 LAB — COMPREHENSIVE METABOLIC PANEL
ALT: 20 U/L (ref 0–44)
AST: 26 U/L (ref 15–41)
Albumin: 4.5 g/dL (ref 3.5–5.0)
Alkaline Phosphatase: 67 U/L (ref 38–126)
Anion gap: 11 (ref 5–15)
BUN: 9 mg/dL (ref 6–20)
CO2: 21 mmol/L — ABNORMAL LOW (ref 22–32)
Calcium: 9.1 mg/dL (ref 8.9–10.3)
Chloride: 107 mmol/L (ref 98–111)
Creatinine, Ser: 0.5 mg/dL (ref 0.44–1.00)
GFR, Estimated: >60 mL/min (ref >60)
Glucose, Bld: 86 mg/dL (ref 70–99)
Potassium: 3.9 mmol/L (ref 3.5–5.1)
Sodium: 139 mmol/L (ref 135–145)
Total Bilirubin: 0.5 mg/dL (ref 0.3–1.2)
Total Protein: 8 g/dL (ref 6.5–8.1)

## 2021-06-06 LAB — RESP PANEL BY RT-PCR (FLU A&B, COVID) ARPGX2
Influenza A by PCR: NEGATIVE
Influenza B by PCR: NEGATIVE
SARS Coronavirus 2 by RT PCR: NEGATIVE

## 2021-06-06 LAB — POC URINE PREG, ED: Preg Test, Ur: NEGATIVE

## 2021-06-06 LAB — ETHANOL: Alcohol, Ethyl (B): 316 mg/dL (ref <10)

## 2021-06-06 LAB — ACETAMINOPHEN LEVEL: Acetaminophen (Tylenol), Serum: <10 ug/mL — ABNORMAL LOW (ref 10–30)

## 2021-06-06 MED ORDER — NICOTINE 14 MG/24HR TD PT24
14.0000 mg | MEDICATED_PATCH | Freq: Once | TRANSDERMAL | Status: AC
  Administered 2021-06-06: 14 mg via TRANSDERMAL
  Filled 2021-06-06: qty 1

## 2021-06-06 MED ORDER — ACETAMINOPHEN 325 MG PO TABS: 650.0000 mg | ORAL_TABLET | ORAL | Status: DC | PRN

## 2021-06-06 MED ORDER — SUMATRIPTAN SUCCINATE 50 MG PO TABS
100.0000 mg | ORAL_TABLET | ORAL | Status: DC | PRN
  Filled 2021-06-06: qty 2

## 2021-06-06 NOTE — ED Notes (Signed)
Pt given shower supplies, in shower at this time. °
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
## 2021-06-06 NOTE — ED Notes (Signed)
°  EDP at bedside to assess patient, per EDP IVC papers will be taken out on patient.

## 2021-06-06 NOTE — ED Notes (Signed)
Pt refused snack but received drink °

## 2021-06-06 NOTE — ED Notes (Signed)
Patient moved to 20h

## 2021-06-06 NOTE — ED Notes (Signed)
Pt received meal tray at this time 
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
## 2021-06-06 NOTE — ED Notes (Signed)
Pt with superficial laceration to L wrist, laceration cleaned, steristrips applied per EDP forbach. Pt denies further needs. Pt visualized in NAD at this time.
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
## 2021-06-06 NOTE — ED Provider Notes (Signed)
[facility] Provider Note    Event Date/Time   First MD Initiated Contact with Patient 06/06/21 0139     (approximate)   History   Psychiatric Evaluation and Alcohol Intoxication   HPI  Danielle Goodman is a 26 y.o. 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  She presents by EMS tonight after her parents called law enforcement with concern for the patient's safety.  The patient has a laceration on her left wrist that she says was an accident but her parents believe was an attempt to kill her self.  She admits to drinking at least 12 beers tonight.  She states that she occasionally drinks but not consistently, maybe 10 beers a week.".  She says she is up-to-date on her Tdap and received a booster shot about 3 months ago.     Physical Exam   Triage Vital Signs: ED Triage Vitals  Enc Vitals Group     BP 06/06/21 0118 (!) 118/95     Pulse Rate 06/06/21 0118 (!) 107     Resp 06/06/21 0118 16     Temp 06/06/21 0118 97.9 F (36.6 C)     Temp src --      SpO2 06/06/21 0118 99 %     Weight 06/06/21 0128 77.1 kg (170 lb)     Height 06/06/21 0128 1.549 m (5\' 1" )     Head Circumference --      Peak Flow --      Pain Score 06/06/21 0128 0     Pain Loc --      Pain Edu? --      Excl. in GC? --     Most recent vital signs: Vitals:   06/06/21 0118  BP: (!) 118/95  Pulse: (!) 107  Resp: 16  Temp: 97.9 F (36.6 C)  SpO2: 99%     General: Awake, no distress.  CV:  Good peripheral perfusion.  Resp:  Normal effort.  Abd:  No distention.  Other:  Approximate 3 to 4 cm laceration along the length of her left wrist, superficial, no active bleeding, no apparent contamination.   ED Results / Procedures / Treatments   Labs (all labs ordered are listed, but only abnormal results are displayed) Labs Reviewed  COMPREHENSIVE METABOLIC PANEL - Abnormal; Notable  for the following components:      Result Value   CO2 21 (*)    All other components within normal limits  ETHANOL - Abnormal; Notable for the following components:   Alcohol, Ethyl (B) 316 (*)    All other components within normal limits  SALICYLATE LEVEL - Abnormal; Notable for the following components:   Salicylate Lvl <7.0 (*)    All other components within normal limits  ACETAMINOPHEN LEVEL - Abnormal; Notable for the following components:   Acetaminophen (Tylenol), Serum <10 (*)    All other components within normal limits  CBC - Abnormal; Notable for the following components:   WBC 10.7 (*)    Platelets 409 (*)    All other components within normal limits  RESP PANEL BY RT-PCR (FLU A&B, COVID) [id number]  URINE DRUG SCREEN, QUALITATIVE ([facility] ONLY)  POC URINE PREG, ED      IMPRESSION / MDM / ASSESSMENT AND PLAN / ED COURSE  I reviewed the triage vital signs and the nursing notes.  Differential diagnosis includes, but is not limited to, depression, alcohol intoxication, substance-induced mood disorder.  Superficial left wrist wound but it seems unlikely to me to be an accident.  I am putting the patient under involuntary commitment given her admitted alcohol intoxication and an apparent suicide attempt that concerned her parents as well.  Vital signs are generally reassuring other than mild tachycardia which is likely situational.  She has no medical complaints or concerns at this time and admits to depression but denies SI.  However I think she is at significant risk of self injury and harm, which justifies IVC.  Labs are pending at this time and then she will be medically cleared if appropriate and placed under IVC and awaiting psychiatry.  I am consulting psychiatry for their evaluation and assessment, though unfortunately we do not have a psychiatry provider at [facility].    Clinical Course as of 06/06/21 0330  Sun Jun 06, 2021  0318 Alcohol,  Ethyl (B)(!!): 316 Ethanol level resulted substantially elevated at 316 [CF]  0318 I reviewed the rest the patient's labs as well.  Acetaminophen and salicylate levels are within normal limits.  CBC is essentially normal.  Comprehensive metabolic panel is within normal limits.  Urine drug screen is negative.  Respiratory viral panel is negative.  The patient has been placed in psychiatric observation due to the need to provide a safe environment for the patient while obtaining psychiatric consultation and evaluation, as well as ongoing medical and medication management to treat the patient's condition.  The patient has been placed under full IVC at this time.   [CF]    Clinical Course User Index [CF] Loleta Rose, MD     FINAL CLINICAL IMPRESSION(S) / ED DIAGNOSES   Final diagnoses:  Depression, unspecified depression type  Alcoholic intoxication with complication (HCC)  Laceration of left wrist, initial encounter     Rx / DC Orders   ED Discharge Orders     None        Note:  This document was prepared using Dragon voice recognition software and may include unintentional dictation errors.   Loleta Rose, MD 06/06/21 0330

## 2021-06-06 NOTE — BH Assessment (Signed)
Writer received phone call from patient's mother Danielle Goodman-[phone]). She returning the phone call from night shifts TTS. Per the mother, she found the patient passed out from her alcohol use. They live in the same home. She also states she was concerned due to her cuts. She expressed concern of the patient hurting her self, because of the events that took place last night.

## 2021-06-06 NOTE — ED Notes (Signed)
Pt received lunch tray.  Pt received nicotine patch  Pt in room resting at this time.  Pt will determine if she needs to use the phone to make arrangements for children and work for tomorrow given she will be here over night.

## 2021-06-06 NOTE — ED Notes (Signed)
Pt denies SI/HI/AVH on assessment. Awaiting psych assessment and she is aware of this and agreeable to this.

## 2021-06-06 NOTE — ED Notes (Signed)
Pt given dinner  

## 2021-06-06 NOTE — BH Assessment (Signed)
Referral information for Psychiatric Hospitalization faxed to;  ° °[facility] ([phone]-or- [phone]) ° °[facility] ([phone]-or- [phone]),  ° °[facility] ([phone]--[phone]) ° °[facility] ([phone]---[phone]), ° °[facility] ([phone], [phone], [phone] or [phone]),  ° °[place] ([phone] or [phone]) ° °[facility] ([phone]),  ° °[facility] ([phone] -or- [phone]),  ° °[facility] ([phone]). ° °[facility] ([phone]) °

## 2021-06-07 DIAGNOSIS — F10920 Alcohol use, unspecified with intoxication, uncomplicated: Secondary | ICD-10-CM | POA: Diagnosis not present

## 2021-06-07 DIAGNOSIS — F10929 Alcohol use, unspecified with intoxication, unspecified: Secondary | ICD-10-CM | POA: Diagnosis present

## 2021-06-07 NOTE — Discharge Instructions (Signed)
Please return for any further problems.  Please follow-up with [facility] as needed.

## 2021-06-07 NOTE — ED Notes (Signed)
IVC PAPERS  RESCINDED  PER  LOUISE BARTHOLD NP

## 2021-06-07 NOTE — ED Provider Notes (Signed)
Emergency Medicine Observation Re-evaluation Note  Jan Walters is a 26 y.o. female, seen on rounds today.  Pt initially presented to the ED for complaints of Psychiatric Evaluation and Alcohol Intoxication Currently, the patient is resting.  Physical Exam  BP 124/80 (BP Location: Right Arm)    Pulse 69    Temp 98.2 F (36.8 C)    Resp 18    Ht 5\' 1"  (1.549 m)    Wt 77.1 kg    SpO2 99%    BMI 32.12 kg/m  Physical Exam Gen: No acute distress  Resp: Normal rise and fall of chest Neuro: Moving all four extremities Psych: Resting currently, calm and cooperative when awake  ED Course / MDM  EKG:   I have reviewed the labs performed to date as well as medications administered while in observation.  Recent changes in the last 24 hours include no acute events overnight.  Plan  Current plan is for psychiatric disposition. Stachia Slutsky is under involuntary commitment.      Rileyann Florance, Vincenza Hews, DO 06/07/21 [phone]

## 2021-06-07 NOTE — BH Assessment (Signed)
Writer spoke with Aqua at [facility] informing that patient no longer needs admission and will be discharged home. Aqua verbalized understanding.

## 2021-06-07 NOTE — ED Notes (Signed)
Spoke with Durenda Age, patient accepted at [facility] Admitting MD:  Dr Florence Canner 2 [facility] Telephone # for report [phone] Patient may go any time after 9 am.

## 2021-06-07 NOTE — ED Notes (Addendum)
Psych at bedside for assessment.

## 2021-06-07 NOTE — ED Notes (Signed)
IVC/pt accepted to [facility] after 9AM.

## 2021-06-07 NOTE — ED Notes (Addendum)
Returned all of belongings. Pt using Danielle Goodman to get home. Verified correct patient and correct discharge papers given. Pt alert and oriented X 4, stable for discharge. RR even and unlabored, color WNL. Discussed discharge instructions and follow-up as directed. Discharge medications discussed, when prescribed. Pt had opportunity to ask questions, and RN available to provide patient and/or family education.

## 2021-06-07 NOTE — ED Notes (Signed)
Pt using phone to set up ride home now.
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
## 2021-06-17 ENCOUNTER — Emergency Department (HOSPITAL_COMMUNITY): Payer: Medicaid Other

## 2021-06-17 ENCOUNTER — Other Ambulatory Visit (HOSPITAL_COMMUNITY): Payer: Self-pay

## 2021-06-17 ENCOUNTER — Inpatient Hospital Stay (HOSPITAL_COMMUNITY): Payer: Medicaid Other

## 2021-06-17 ENCOUNTER — Inpatient Hospital Stay (HOSPITAL_COMMUNITY)
Admission: EM | Admit: 2021-06-17 | Discharge: 2021-06-23 | DRG: 481 | Disposition: A | Discharge status: Home / Self Care | Payer: Medicaid Other | Attending: Student | Admitting: Student

## 2021-06-17 DIAGNOSIS — D62 Acute posthemorrhagic anemia: Secondary | ICD-10-CM | POA: Diagnosis not present

## 2021-06-17 DIAGNOSIS — F909 Attention-deficit hyperactivity disorder, unspecified type: Secondary | ICD-10-CM | POA: Diagnosis present

## 2021-06-17 DIAGNOSIS — E669 Obesity, unspecified: Secondary | ICD-10-CM | POA: Diagnosis present

## 2021-06-17 DIAGNOSIS — Z23 Encounter for immunization: Secondary | ICD-10-CM

## 2021-06-17 DIAGNOSIS — S728X2A Other fracture of left femur, initial encounter for closed fracture: Secondary | ICD-10-CM | POA: Diagnosis present

## 2021-06-17 DIAGNOSIS — Y92410 Unspecified street and highway as the place of occurrence of the external cause: Secondary | ICD-10-CM

## 2021-06-17 DIAGNOSIS — Z9049 Acquired absence of other specified parts of digestive tract: Secondary | ICD-10-CM | POA: Diagnosis not present

## 2021-06-17 DIAGNOSIS — Z20822 Contact with and (suspected) exposure to covid-19: Secondary | ICD-10-CM | POA: Diagnosis present

## 2021-06-17 DIAGNOSIS — S72452B Displaced supracondylar fracture without intracondylar extension of lower end of left femur, initial encounter for open fracture type I or II: Secondary | ICD-10-CM | POA: Diagnosis present

## 2021-06-17 DIAGNOSIS — S82841A Displaced bimalleolar fracture of right lower leg, initial encounter for closed fracture: Secondary | ICD-10-CM

## 2021-06-17 DIAGNOSIS — M79605 Pain in left leg: Secondary | ICD-10-CM | POA: Diagnosis present

## 2021-06-17 DIAGNOSIS — Z79899 Other long term (current) drug therapy: Secondary | ICD-10-CM | POA: Diagnosis not present

## 2021-06-17 DIAGNOSIS — Z6831 Body mass index (BMI) 31.0-31.9, adult: Secondary | ICD-10-CM

## 2021-06-17 DIAGNOSIS — T1490XA Injury, unspecified, initial encounter: Secondary | ICD-10-CM

## 2021-06-17 DIAGNOSIS — J45909 Unspecified asthma, uncomplicated: Secondary | ICD-10-CM | POA: Diagnosis present

## 2021-06-17 DIAGNOSIS — S76122A Laceration of left quadriceps muscle, fascia and tendon, initial encounter: Secondary | ICD-10-CM | POA: Diagnosis present

## 2021-06-17 DIAGNOSIS — F419 Anxiety disorder, unspecified: Secondary | ICD-10-CM | POA: Diagnosis not present

## 2021-06-17 DIAGNOSIS — T148XXA Other injury of unspecified body region, initial encounter: Secondary | ICD-10-CM

## 2021-06-17 DIAGNOSIS — S72462A Displaced supracondylar fracture with intracondylar extension of lower end of left femur, initial encounter for closed fracture: Secondary | ICD-10-CM | POA: Diagnosis not present

## 2021-06-17 DIAGNOSIS — Z419 Encounter for procedure for purposes other than remedying health state, unspecified: Secondary | ICD-10-CM

## 2021-06-17 DIAGNOSIS — Z87891 Personal history of nicotine dependence: Secondary | ICD-10-CM

## 2021-06-17 LAB — CBC
HCT: 38.9 % (ref 36.0–46.0)
Hemoglobin: 12.8 g/dL (ref 12.0–15.0)
MCH: 30.4 pg (ref 26.0–34.0)
MCHC: 32.9 g/dL (ref 30.0–36.0)
MCV: 92.4 fL (ref 80.0–100.0)
Platelets: 409 10*3/uL — ABNORMAL HIGH (ref 150–400)
RBC: 4.21 MIL/uL (ref 3.87–5.11)
RDW: 12.8 % (ref 11.5–15.5)
WBC: 8.4 10*3/uL (ref 4.0–10.5)
nRBC: 0 % (ref 0.0–0.2)

## 2021-06-17 LAB — ETHANOL: Alcohol, Ethyl (B): 362 mg/dL (ref <10)

## 2021-06-17 LAB — LACTIC ACID, PLASMA: Lactic Acid, Venous: 2 mmol/L (ref 0.5–1.9)

## 2021-06-17 LAB — COMPREHENSIVE METABOLIC PANEL
ALT: 34 U/L (ref 0–44)
AST: 55 U/L — ABNORMAL HIGH (ref 15–41)
Albumin: 4.1 g/dL (ref 3.5–5.0)
Alkaline Phosphatase: 75 U/L (ref 38–126)
Anion gap: 14 (ref 5–15)
BUN: 5 mg/dL — ABNORMAL LOW (ref 6–20)
CO2: 16 mmol/L — ABNORMAL LOW (ref 22–32)
Calcium: 8.4 mg/dL — ABNORMAL LOW (ref 8.9–10.3)
Chloride: 107 mmol/L (ref 98–111)
Creatinine, Ser: 0.67 mg/dL (ref 0.44–1.00)
GFR, Estimated: >60 mL/min (ref >60)
Glucose, Bld: 104 mg/dL — ABNORMAL HIGH (ref 70–99)
Potassium: 4.3 mmol/L (ref 3.5–5.1)
Sodium: 137 mmol/L (ref 135–145)
Total Bilirubin: 1.3 mg/dL — ABNORMAL HIGH (ref 0.3–1.2)
Total Protein: 7.5 g/dL (ref 6.5–8.1)

## 2021-06-17 LAB — I-STAT CHEM 8, ED
BUN: 4 mg/dL — ABNORMAL LOW (ref 6–20)
Calcium, Ion: 1.05 mmol/L — ABNORMAL LOW (ref 1.15–1.40)
Chloride: 109 mmol/L (ref 98–111)
Creatinine, Ser: 1 mg/dL (ref 0.44–1.00)
Glucose, Bld: 102 mg/dL — ABNORMAL HIGH (ref 70–99)
HCT: 39 % (ref 36.0–46.0)
Hemoglobin: 13.3 g/dL (ref 12.0–15.0)
Potassium: 5.9 mmol/L — ABNORMAL HIGH (ref 3.5–5.1)
Sodium: 138 mmol/L (ref 135–145)
TCO2: 20 mmol/L — ABNORMAL LOW (ref 22–32)

## 2021-06-17 LAB — URINALYSIS, ROUTINE W REFLEX MICROSCOPIC
Bacteria, UA: NONE SEEN
Bilirubin Urine: NEGATIVE
Glucose, UA: NEGATIVE mg/dL
Ketones, ur: NEGATIVE mg/dL
Leukocytes,Ua: NEGATIVE
Nitrite: NEGATIVE
Protein, ur: NEGATIVE mg/dL
Specific Gravity, Urine: 1.018 (ref 1.005–1.030)
pH: 6 (ref 5.0–8.0)

## 2021-06-17 LAB — RESP PANEL BY RT-PCR (FLU A&B, COVID) ARPGX2
Influenza A by PCR: NEGATIVE
Influenza B by PCR: NEGATIVE
SARS Coronavirus 2 by RT PCR: NEGATIVE

## 2021-06-17 LAB — PROTIME-INR
INR: 0.9 (ref 0.8–1.2)
Prothrombin Time: 12.1 seconds (ref 11.4–15.2)

## 2021-06-17 LAB — SAMPLE TO BLOOD BANK

## 2021-06-17 MED ORDER — FENTANYL CITRATE PF 50 MCG/ML IJ SOSY: 50.0000 ug | PREFILLED_SYRINGE | Freq: Once | INTRAMUSCULAR | Status: AC

## 2021-06-17 MED ORDER — IOHEXOL 300 MG/ML  SOLN
100.0000 mL | Freq: Once | INTRAMUSCULAR | Status: AC | PRN
  Administered 2021-06-17: 100 mL via INTRAVENOUS

## 2021-06-17 MED ORDER — CEFAZOLIN SODIUM-DEXTROSE 2-4 GM/100ML-% IV SOLN
2.0000 g | Freq: Three times a day (TID) | INTRAVENOUS | Status: DC
  Administered 2021-06-18 (×3): 2 g via INTRAVENOUS
  Filled 2021-06-17 (×3): qty 100

## 2021-06-17 MED ORDER — HYDROCODONE-ACETAMINOPHEN 5-325 MG PO TABS
1.0000 | ORAL_TABLET | Freq: Once | ORAL | Status: AC
Start: 2021-06-17 — End: 2021-06-17
  Administered 2021-06-17: 1 via ORAL
  Filled 2021-06-17: qty 1

## 2021-06-17 MED ORDER — ONDANSETRON HCL 4 MG/2ML IJ SOLN
4.0000 mg | Freq: Once | INTRAMUSCULAR | Status: AC
Start: 2021-06-17 — End: 2021-06-17
  Administered 2021-06-17: 4 mg via INTRAVENOUS
  Filled 2021-06-17: qty 2

## 2021-06-17 MED ORDER — TETANUS-DIPHTH-ACELL PERTUSSIS 5-2.5-18.5 LF-MCG/0.5 IM SUSY
0.5000 mL | PREFILLED_SYRINGE | Freq: Once | INTRAMUSCULAR | Status: AC
  Administered 2021-06-17: 0.5 mL via INTRAMUSCULAR

## 2021-06-17 MED ORDER — ACETAMINOPHEN 500 MG PO TABS
1000.0000 mg | ORAL_TABLET | Freq: Four times a day (QID) | ORAL | Status: AC
  Administered 2021-06-18 (×3): 1000 mg via ORAL
  Filled 2021-06-17 (×3): qty 2

## 2021-06-17 MED ORDER — FENTANYL CITRATE PF 50 MCG/ML IJ SOSY
50.0000 ug | PREFILLED_SYRINGE | Freq: Once | INTRAMUSCULAR | Status: AC
  Administered 2021-06-17: 50 ug via INTRAVENOUS
  Filled 2021-06-17: qty 1

## 2021-06-17 MED ORDER — LACTATED RINGERS IV SOLN: INTRAVENOUS | Status: DC

## 2021-06-17 MED ORDER — DIPHENHYDRAMINE HCL 12.5 MG/5ML PO ELIX: 12.5000 mg | ORAL_SOLUTION | ORAL | Status: DC | PRN

## 2021-06-17 MED ORDER — ACETAMINOPHEN 325 MG PO TABS: 650.0000 mg | ORAL_TABLET | Freq: Four times a day (QID) | ORAL | Status: DC | PRN

## 2021-06-17 MED ORDER — METHOCARBAMOL 500 MG PO TABS
500.0000 mg | ORAL_TABLET | Freq: Four times a day (QID) | ORAL | Status: DC | PRN
  Administered 2021-06-18 – 2021-06-23 (×8): 500 mg via ORAL
  Filled 2021-06-17 (×9): qty 1

## 2021-06-17 MED ORDER — HYDROMORPHONE HCL 1 MG/ML IJ SOLN
1.0000 mg | Freq: Once | INTRAMUSCULAR | Status: AC | PRN
Start: 2021-06-17 — End: 2021-06-17
  Administered 2021-06-17: 1 mg via INTRAVENOUS
  Filled 2021-06-17: qty 1

## 2021-06-17 MED ORDER — LIDOCAINE HCL (PF) 1 % IJ SOLN
30.0000 mL | Freq: Once | INTRAMUSCULAR | Status: AC
  Administered 2021-06-17: 30 mL via INTRADERMAL
  Filled 2021-06-17: qty 30

## 2021-06-17 MED ORDER — HYDROMORPHONE HCL 1 MG/ML IJ SOLN
0.5000 mg | INTRAMUSCULAR | Status: DC | PRN
  Administered 2021-06-18 (×2): 1 mg via INTRAVENOUS
  Filled 2021-06-17 (×4): qty 1

## 2021-06-17 MED ORDER — OXYCODONE HCL 5 MG PO TABS
5.0000 mg | ORAL_TABLET | ORAL | Status: DC | PRN
  Administered 2021-06-19 – 2021-06-23 (×5): 10 mg via ORAL
  Filled 2021-06-17: qty 2
  Filled 2021-06-17: qty 1
  Filled 2021-06-17 (×5): qty 2

## 2021-06-17 MED ORDER — METHOCARBAMOL 1000 MG/10ML IJ SOLN
500.0000 mg | Freq: Four times a day (QID) | INTRAVENOUS | Status: DC | PRN
  Administered 2021-06-19: 500 mg via INTRAVENOUS
  Filled 2021-06-17 (×3): qty 5

## 2021-06-17 MED ORDER — FENTANYL CITRATE PF 50 MCG/ML IJ SOSY
PREFILLED_SYRINGE | INTRAMUSCULAR | Status: AC
  Administered 2021-06-17: 50 ug
  Filled 2021-06-17: qty 1

## 2021-06-17 MED ORDER — ACETAMINOPHEN 325 MG PO TABS: 325.0000 mg | ORAL_TABLET | Freq: Four times a day (QID) | ORAL | Status: DC | PRN

## 2021-06-17 MED ORDER — OXYCODONE HCL 5 MG PO TABS
10.0000 mg | ORAL_TABLET | ORAL | Status: DC | PRN
  Administered 2021-06-18 (×5): 15 mg via ORAL
  Administered 2021-06-19: 10 mg via ORAL
  Administered 2021-06-19 (×2): 15 mg via ORAL
  Administered 2021-06-19: 10 mg via ORAL
  Administered 2021-06-19 – 2021-06-20 (×6): 15 mg via ORAL
  Administered 2021-06-21: 10 mg via ORAL
  Administered 2021-06-21 (×2): 15 mg via ORAL
  Administered 2021-06-22 (×3): 10 mg via ORAL
  Filled 2021-06-17 (×2): qty 3
  Filled 2021-06-17 (×2): qty 2
  Filled 2021-06-17 (×16): qty 3

## 2021-06-17 MED ORDER — FENTANYL CITRATE (PF) 100 MCG/2ML IJ SOLN
INTRAMUSCULAR | Status: AC
  Administered 2021-06-17: 50 ug
  Filled 2021-06-17: qty 2

## 2021-06-17 NOTE — ED Notes (Signed)
Pt returned to ED from CT, ortho tech called to replace traction.

## 2021-06-17 NOTE — ED Notes (Signed)
Received verbal report from Callie S RN at this time °
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
## 2021-06-17 NOTE — ED Notes (Signed)
Patient transported to CT 

## 2021-06-17 NOTE — ED Notes (Signed)
C-collar removed per EDP.  

## 2021-06-17 NOTE — Progress Notes (Signed)
Orthopedic Tech Progress Note Patient Details:  Danielle Goodman 1995-06-03 [id number]  Ortho Devices Type of Ortho Device: Post (short leg) splint, Stirrup splint Ortho Device/Splint Location: RLE Ortho Device/Splint Interventions: Ordered, Application, Adjustment   Post Interventions Patient Tolerated: Fair Instructions Provided: Care of device, Poper ambulation with device  Danielle Goodman 06/17/2021, 9:45 PM

## 2021-06-17 NOTE — ED Notes (Signed)
Pt placed on hospital bed, ortho tech at bedside to apply traction. Pt given water per ortho MD.
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
## 2021-06-17 NOTE — ED Notes (Signed)
Pt moved to room 40 at this time 

## 2021-06-18 ENCOUNTER — Other Ambulatory Visit: Payer: Self-pay

## 2021-06-18 ENCOUNTER — Encounter (HOSPITAL_COMMUNITY): Payer: Self-pay | Admitting: Orthopedic Surgery

## 2021-06-18 ENCOUNTER — Encounter (HOSPITAL_COMMUNITY)
Admission: EM | Disposition: A | Discharge status: Home / Self Care | Payer: Self-pay | Source: Home / Self Care | Attending: Orthopedic Surgery

## 2021-06-18 ENCOUNTER — Inpatient Hospital Stay (HOSPITAL_COMMUNITY): Payer: Medicaid Other

## 2021-06-18 ENCOUNTER — Inpatient Hospital Stay (HOSPITAL_COMMUNITY): Payer: Medicaid Other | Admitting: Anesthesiology

## 2021-06-18 DIAGNOSIS — F419 Anxiety disorder, unspecified: Secondary | ICD-10-CM

## 2021-06-18 DIAGNOSIS — S72462A Displaced supracondylar fracture with intracondylar extension of lower end of left femur, initial encounter for closed fracture: Secondary | ICD-10-CM

## 2021-06-18 DIAGNOSIS — S82841A Displaced bimalleolar fracture of right lower leg, initial encounter for closed fracture: Secondary | ICD-10-CM | POA: Diagnosis not present

## 2021-06-18 HISTORY — PX: ORIF ANKLE FRACTURE: SHX5408

## 2021-06-18 HISTORY — PX: ORIF FEMUR FRACTURE: SHX2119

## 2021-06-18 LAB — CBC
HCT: 32.7 % — ABNORMAL LOW (ref 36.0–46.0)
Hemoglobin: 10.8 g/dL — ABNORMAL LOW (ref 12.0–15.0)
MCH: 30.6 pg (ref 26.0–34.0)
MCHC: 33 g/dL (ref 30.0–36.0)
MCV: 92.6 fL (ref 80.0–100.0)
Platelets: 384 10*3/uL (ref 150–400)
RBC: 3.53 MIL/uL — ABNORMAL LOW (ref 3.87–5.11)
RDW: 13.2 % (ref 11.5–15.5)
WBC: 15.1 10*3/uL — ABNORMAL HIGH (ref 4.0–10.5)
nRBC: 0 % (ref 0.0–0.2)

## 2021-06-18 LAB — SURGICAL PCR SCREEN
MRSA, PCR: NEGATIVE
Staphylococcus aureus: NEGATIVE

## 2021-06-18 LAB — CREATININE, SERUM
Creatinine, Ser: 0.49 mg/dL (ref 0.44–1.00)
GFR, Estimated: >60 mL/min (ref >60)

## 2021-06-18 LAB — POCT PREGNANCY, URINE: Preg Test, Ur: NEGATIVE

## 2021-06-18 LAB — VITAMIN D 25 HYDROXY (VIT D DEFICIENCY, FRACTURES): Vit D, 25-Hydroxy: 16.17 ng/mL — ABNORMAL LOW (ref 30–100)

## 2021-06-18 SURGERY — OPEN REDUCTION INTERNAL FIXATION (ORIF) DISTAL FEMUR FRACTURE
Anesthesia: General | Site: Ankle | Laterality: Right

## 2021-06-18 MED ORDER — HYDROMORPHONE HCL 1 MG/ML IJ SOLN
INTRAMUSCULAR | Status: DC | PRN
  Administered 2021-06-18: .5 mg via INTRAVENOUS

## 2021-06-18 MED ORDER — METOCLOPRAMIDE HCL 5 MG/ML IJ SOLN: 5.0000 mg | Freq: Three times a day (TID) | INTRAMUSCULAR | Status: DC | PRN

## 2021-06-18 MED ORDER — LACTATED RINGERS IV SOLN: INTRAVENOUS | Status: DC

## 2021-06-18 MED ORDER — VANCOMYCIN HCL 1000 MG IV SOLR
INTRAVENOUS | Status: DC | PRN
Start: 2021-06-18 — End: 2021-06-18
  Administered 2021-06-18 (×2): 1000 mg

## 2021-06-18 MED ORDER — CHLORHEXIDINE GLUCONATE 4 % EX LIQD
60.0000 mL | Freq: Once | CUTANEOUS | Status: DC
  Filled 2021-06-18: qty 60

## 2021-06-18 MED ORDER — METOCLOPRAMIDE HCL 10 MG PO TABS: 5.0000 mg | ORAL_TABLET | Freq: Three times a day (TID) | ORAL | Status: DC | PRN

## 2021-06-18 MED ORDER — KETAMINE HCL-SODIUM CHLORIDE 100-0.9 MG/10ML-% IV SOSY
PREFILLED_SYRINGE | INTRAVENOUS | Status: DC | PRN
  Administered 2021-06-18: 30 mg via INTRAVENOUS
  Administered 2021-06-18 (×2): 10 mg via INTRAVENOUS

## 2021-06-18 MED ORDER — ALBUMIN HUMAN 5 % IV SOLN
INTRAVENOUS | Status: DC | PRN
Start: 2021-06-18 — End: 2021-06-18

## 2021-06-18 MED ORDER — VANCOMYCIN HCL 1000 MG IV SOLR
INTRAVENOUS | Status: AC
  Filled 2021-06-18: qty 20

## 2021-06-18 MED ORDER — CHLORHEXIDINE GLUCONATE 0.12 % MT SOLN: 15.0000 mL | Freq: Once | OROMUCOSAL | Status: AC

## 2021-06-18 MED ORDER — DOCUSATE SODIUM 100 MG PO CAPS
100.0000 mg | ORAL_CAPSULE | Freq: Two times a day (BID) | ORAL | Status: DC
  Administered 2021-06-18 – 2021-06-23 (×10): 100 mg via ORAL
  Filled 2021-06-18 (×11): qty 1

## 2021-06-18 MED ORDER — ONDANSETRON HCL 4 MG/2ML IJ SOLN
4.0000 mg | Freq: Four times a day (QID) | INTRAMUSCULAR | Status: DC | PRN
  Administered 2021-06-18 – 2021-06-21 (×4): 4 mg via INTRAVENOUS
  Filled 2021-06-18 (×6): qty 2

## 2021-06-18 MED ORDER — PROMETHAZINE HCL 25 MG/ML IJ SOLN: 6.2500 mg | INTRAMUSCULAR | Status: DC | PRN

## 2021-06-18 MED ORDER — DEXMEDETOMIDINE (PRECEDEX) IN NS 20 MCG/5ML (4 MCG/ML) IV SYRINGE
PREFILLED_SYRINGE | INTRAVENOUS | Status: DC | PRN
  Administered 2021-06-18: 12 ug via INTRAVENOUS
  Administered 2021-06-18: 8 ug via INTRAVENOUS

## 2021-06-18 MED ORDER — PROPOFOL 10 MG/ML IV BOLUS
INTRAVENOUS | Status: AC
  Filled 2021-06-18: qty 20

## 2021-06-18 MED ORDER — ONDANSETRON HCL 4 MG/2ML IJ SOLN
INTRAMUSCULAR | Status: DC | PRN
Start: 2021-06-18 — End: 2021-06-18
  Administered 2021-06-18: 4 mg via INTRAVENOUS

## 2021-06-18 MED ORDER — SODIUM CHLORIDE 0.9 % IV SOLN: INTRAVENOUS | Status: DC

## 2021-06-18 MED ORDER — ONDANSETRON HCL 4 MG PO TABS: 4.0000 mg | ORAL_TABLET | Freq: Four times a day (QID) | ORAL | Status: DC | PRN

## 2021-06-18 MED ORDER — POVIDONE-IODINE 10 % EX SWAB: 2.0000 "application " | Freq: Once | CUTANEOUS | Status: DC

## 2021-06-18 MED ORDER — KETOROLAC TROMETHAMINE 15 MG/ML IJ SOLN
15.0000 mg | Freq: Four times a day (QID) | INTRAMUSCULAR | Status: AC
  Administered 2021-06-19 (×3): 15 mg via INTRAVENOUS
  Filled 2021-06-18 (×4): qty 1

## 2021-06-18 MED ORDER — FENTANYL CITRATE (PF) 100 MCG/2ML IJ SOLN
INTRAMUSCULAR | Status: AC
  Filled 2021-06-18: qty 2

## 2021-06-18 MED ORDER — ROCURONIUM BROMIDE 10 MG/ML (PF) SYRINGE
PREFILLED_SYRINGE | INTRAVENOUS | Status: DC | PRN
Start: 2021-06-18 — End: 2021-06-18
  Administered 2021-06-18: 100 mg via INTRAVENOUS
  Administered 2021-06-18: 30 mg via INTRAVENOUS

## 2021-06-18 MED ORDER — FENTANYL CITRATE (PF) 250 MCG/5ML IJ SOLN
INTRAMUSCULAR | Status: AC
  Filled 2021-06-18: qty 5

## 2021-06-18 MED ORDER — HYDRALAZINE HCL 10 MG PO TABS: 10.0000 mg | ORAL_TABLET | Freq: Four times a day (QID) | ORAL | Status: DC | PRN

## 2021-06-18 MED ORDER — POLYETHYLENE GLYCOL 3350 17 G PO PACK
17.0000 g | PACK | Freq: Every day | ORAL | Status: DC | PRN
  Administered 2021-06-19 – 2021-06-23 (×4): 17 g via ORAL
  Filled 2021-06-18 (×4): qty 1

## 2021-06-18 MED ORDER — PHENYLEPHRINE 40 MCG/ML (10ML) SYRINGE FOR IV PUSH (FOR BLOOD PRESSURE SUPPORT)
PREFILLED_SYRINGE | INTRAVENOUS | Status: DC | PRN
Start: 2021-06-18 — End: 2021-06-18
  Administered 2021-06-18: 80 ug via INTRAVENOUS
  Administered 2021-06-18: 40 ug via INTRAVENOUS

## 2021-06-18 MED ORDER — CEFAZOLIN SODIUM-DEXTROSE 2-4 GM/100ML-% IV SOLN
2.0000 g | Freq: Three times a day (TID) | INTRAVENOUS | Status: AC
  Administered 2021-06-18 – 2021-06-19 (×2): 2 g via INTRAVENOUS
  Filled 2021-06-18 (×2): qty 100

## 2021-06-18 MED ORDER — MIDAZOLAM HCL 2 MG/2ML IJ SOLN
INTRAMUSCULAR | Status: DC | PRN
  Administered 2021-06-18: 2 mg via INTRAVENOUS

## 2021-06-18 MED ORDER — FENTANYL CITRATE (PF) 250 MCG/5ML IJ SOLN
INTRAMUSCULAR | Status: DC | PRN
  Administered 2021-06-18: 50 ug via INTRAVENOUS
  Administered 2021-06-18: 100 ug via INTRAVENOUS
  Administered 2021-06-18 (×2): 50 ug via INTRAVENOUS

## 2021-06-18 MED ORDER — CELECOXIB 200 MG PO CAPS
200.0000 mg | ORAL_CAPSULE | Freq: Once | ORAL | Status: AC
  Administered 2021-06-18: 200 mg via ORAL
  Filled 2021-06-18: qty 1

## 2021-06-18 MED ORDER — ROCURONIUM BROMIDE 10 MG/ML (PF) SYRINGE
PREFILLED_SYRINGE | INTRAVENOUS | Status: AC
  Filled 2021-06-18: qty 10

## 2021-06-18 MED ORDER — SODIUM CHLORIDE 0.9 % IR SOLN
Status: DC | PRN
  Administered 2021-06-18 (×2): 3000 mL

## 2021-06-18 MED ORDER — CHLORHEXIDINE GLUCONATE 0.12 % MT SOLN
OROMUCOSAL | Status: AC
  Administered 2021-06-18: 15 mL via OROMUCOSAL
  Filled 2021-06-18: qty 15

## 2021-06-18 MED ORDER — 0.9 % SODIUM CHLORIDE (POUR BTL) OPTIME
TOPICAL | Status: DC | PRN
  Administered 2021-06-18: 1000 mL

## 2021-06-18 MED ORDER — HYDROMORPHONE HCL 1 MG/ML IJ SOLN
INTRAMUSCULAR | Status: AC
  Filled 2021-06-18: qty 0.5

## 2021-06-18 MED ORDER — KETAMINE HCL 50 MG/5ML IJ SOSY
PREFILLED_SYRINGE | INTRAMUSCULAR | Status: AC
  Filled 2021-06-18: qty 5

## 2021-06-18 MED ORDER — DEXAMETHASONE SODIUM PHOSPHATE 10 MG/ML IJ SOLN
INTRAMUSCULAR | Status: DC | PRN
  Administered 2021-06-18: 10 mg via INTRAVENOUS

## 2021-06-18 MED ORDER — FENTANYL CITRATE (PF) 100 MCG/2ML IJ SOLN
25.0000 ug | INTRAMUSCULAR | Status: DC | PRN
  Administered 2021-06-18 (×3): 50 ug via INTRAVENOUS

## 2021-06-18 MED ORDER — CEFAZOLIN SODIUM-DEXTROSE 2-4 GM/100ML-% IV SOLN
2.0000 g | INTRAVENOUS | Status: AC
  Administered 2021-06-18: 2 g via INTRAVENOUS
  Filled 2021-06-18: qty 100

## 2021-06-18 MED ORDER — MIDAZOLAM HCL 2 MG/2ML IJ SOLN
INTRAMUSCULAR | Status: AC
  Filled 2021-06-18: qty 2

## 2021-06-18 MED ORDER — LIDOCAINE 2% (20 MG/ML) 5 ML SYRINGE
INTRAMUSCULAR | Status: DC | PRN
  Administered 2021-06-18: 100 mg via INTRAVENOUS

## 2021-06-18 MED ORDER — SUGAMMADEX SODIUM 200 MG/2ML IV SOLN
INTRAVENOUS | Status: DC | PRN
  Administered 2021-06-18: 200 mg via INTRAVENOUS

## 2021-06-18 MED ORDER — PROPOFOL 10 MG/ML IV BOLUS
INTRAVENOUS | Status: DC | PRN
  Administered 2021-06-18: 200 mg via INTRAVENOUS

## 2021-06-18 MED ORDER — ORAL CARE MOUTH RINSE: 15.0000 mL | Freq: Once | OROMUCOSAL | Status: AC

## 2021-06-18 MED ORDER — ENOXAPARIN SODIUM 40 MG/0.4ML IJ SOSY
40.0000 mg | PREFILLED_SYRINGE | INTRAMUSCULAR | Status: DC
  Administered 2021-06-19 – 2021-06-23 (×5): 40 mg via SUBCUTANEOUS
  Filled 2021-06-18 (×5): qty 0.4

## 2021-06-18 MED ORDER — AMISULPRIDE (ANTIEMETIC) 5 MG/2ML IV SOLN: 10.0000 mg | Freq: Once | INTRAVENOUS | Status: DC | PRN

## 2021-06-18 MED ORDER — DEXMEDETOMIDINE (PRECEDEX) IN NS 20 MCG/5ML (4 MCG/ML) IV SYRINGE
PREFILLED_SYRINGE | INTRAVENOUS | Status: AC
  Filled 2021-06-18: qty 5

## 2021-06-18 SURGICAL SUPPLY — 110 items
BAG COUNTER SPONGE SURGICOUNT (BAG) ×3 IMPLANT
BANDAGE ESMARK 6X9 LF (GAUZE/BANDAGES/DRESSINGS) ×2 IMPLANT
BIT DRILL 3.0 6IN (DRILL) IMPLANT
BIT DRILL 4.3 (BIT) ×3
BIT DRILL 4.3X300MM (BIT) IMPLANT
BIT DRILL LONG 3.3 (BIT) ×1 IMPLANT
BIT DRILL QC 2.0 SHORT EVOS SM (DRILL) IMPLANT
BIT DRILL QC 2.5MM SHRT EVO SM (DRILL) IMPLANT
BIT DRILL QC 3.3X195 (BIT) ×1 IMPLANT
BLADE CLIPPER SURG (BLADE) IMPLANT
BNDG COHESIVE 4X5 TAN STRL (GAUZE/BANDAGES/DRESSINGS) ×3 IMPLANT
BNDG COHESIVE 6X5 TAN STRL LF (GAUZE/BANDAGES/DRESSINGS) ×3 IMPLANT
BNDG ELASTIC 4X5.8 VLCR STR LF (GAUZE/BANDAGES/DRESSINGS) ×2 IMPLANT
BNDG ELASTIC 6X10 VLCR STRL LF (GAUZE/BANDAGES/DRESSINGS) ×3 IMPLANT
BNDG ELASTIC 6X5.8 VLCR STR LF (GAUZE/BANDAGES/DRESSINGS) ×1 IMPLANT
BNDG ESMARK 6X9 LF (GAUZE/BANDAGES/DRESSINGS) ×3
BRUSH SCRUB EZ PLAIN DRY (MISCELLANEOUS) ×6 IMPLANT
CAP LOCK NCB (Cap) ×5 IMPLANT
CHLORAPREP W/TINT 26 (MISCELLANEOUS) ×3 IMPLANT
COVER SURGICAL LIGHT HANDLE (MISCELLANEOUS) ×3 IMPLANT
DERMABOND ADVANCED (GAUZE/BANDAGES/DRESSINGS) ×2
DERMABOND ADVANCED .7 DNX12 (GAUZE/BANDAGES/DRESSINGS) IMPLANT
DRAPE C-ARM 42X72 X-RAY (DRAPES) ×3 IMPLANT
DRAPE C-ARMOR (DRAPES) ×3 IMPLANT
DRAPE HALF SHEET 40X57 (DRAPES) ×6 IMPLANT
DRAPE ORTHO SPLIT 77X108 STRL (DRAPES) ×8
DRAPE SURG 17X23 STRL (DRAPES) ×3 IMPLANT
DRAPE SURG ORHT 6 SPLT 77X108 (DRAPES) ×4 IMPLANT
DRAPE U-SHAPE 47X51 STRL (DRAPES) ×4 IMPLANT
DRILL 3.0 6IN (DRILL) ×3
DRILL QC 2.0 SHORT EVOS SM (DRILL) ×3
DRILL QC 2.5MM SHORT EVOS SM (DRILL) ×3
DRSG ADAPTIC 3X8 NADH LF (GAUZE/BANDAGES/DRESSINGS) IMPLANT
DRSG MEPILEX BORDER 4X12 (GAUZE/BANDAGES/DRESSINGS) ×1 IMPLANT
DRSG MEPILEX BORDER 4X4 (GAUZE/BANDAGES/DRESSINGS) IMPLANT
DRSG MEPILEX BORDER 4X8 (GAUZE/BANDAGES/DRESSINGS) ×1 IMPLANT
ELECT REM PT RETURN 9FT ADLT (ELECTROSURGICAL) ×3
ELECTRODE REM PT RTRN 9FT ADLT (ELECTROSURGICAL) ×2 IMPLANT
GAUZE SPONGE 4X4 12PLY STRL (GAUZE/BANDAGES/DRESSINGS) ×3 IMPLANT
GLOVE SURG ENC MOIS LTX SZ6.5 (GLOVE) ×9 IMPLANT
GLOVE SURG ENC MOIS LTX SZ7.5 (GLOVE) ×12 IMPLANT
GLOVE SURG UNDER POLY LF SZ6.5 (GLOVE) ×3 IMPLANT
GLOVE SURG UNDER POLY LF SZ7.5 (GLOVE) ×3 IMPLANT
GOWN STRL REUS W/ TWL LRG LVL3 (GOWN DISPOSABLE) ×6 IMPLANT
GOWN STRL REUS W/TWL LRG LVL3 (GOWN DISPOSABLE) ×6
GUIDEWIRE 1.6 6IN (WIRE) ×1 IMPLANT
HALF PIN 5.0X160 (EXFIX) ×2 IMPLANT
HANDPIECE INTERPULSE COAX TIP (DISPOSABLE) ×2
K-WIRE 1.6 (WIRE) ×2
K-WIRE 2.0 (WIRE) ×2
K-WIRE FX150X1.6XTROC PNT (WIRE) ×2
K-WIRE FXSTD 280X2XNS SS (WIRE) ×2
KIT BASIN OR (CUSTOM PROCEDURE TRAY) ×3 IMPLANT
KIT TURNOVER KIT B (KITS) ×3 IMPLANT
KWIRE FX150X1.6XTROC PNT (WIRE) IMPLANT
KWIRE FXSTD 280X2XNS SS (WIRE) IMPLANT
MANIFOLD NEPTUNE II (INSTRUMENTS) ×3 IMPLANT
NDL HYPO 21X1.5 SAFETY (NEEDLE) IMPLANT
NDL HYPO 25GX1X1/2 BEV (NEEDLE) ×2 IMPLANT
NEEDLE HYPO 21X1.5 SAFETY (NEEDLE) IMPLANT
NEEDLE HYPO 25GX1X1/2 BEV (NEEDLE) ×3 IMPLANT
NS IRRIG 1000ML POUR BTL (IV SOLUTION) ×3 IMPLANT
PACK TOTAL JOINT (CUSTOM PROCEDURE TRAY) ×3 IMPLANT
PAD ARMBOARD 7.5X6 YLW CONV (MISCELLANEOUS) ×6 IMPLANT
PAD CAST 4YDX4 CTTN HI CHSV (CAST SUPPLIES) IMPLANT
PADDING CAST COTTON 4X4 STRL (CAST SUPPLIES) ×4
PADDING CAST COTTON 6X4 STRL (CAST SUPPLIES) ×1 IMPLANT
PLATE DIST FEM 12H (Plate) ×1 IMPLANT
PLATE FIB EVOS 81X2.7/3.5 5H (Plate) ×1 IMPLANT
SCREW 5.0 60MM (Screw) ×2 IMPLANT
SCREW 5.0 70MM (Screw) ×2 IMPLANT
SCREW CANN HDLS 4X54 (Screw) ×1 IMPLANT
SCREW CANN HDLS 4X65 (Screw) ×1 IMPLANT
SCREW CORT 2.7X14 T8 EVOS (Screw) ×2 IMPLANT
SCREW CORT 2.7X15 T8 ST EVOS (Screw) ×1 IMPLANT
SCREW CORT 2.7X16 ST EVOS (Screw) ×1 IMPLANT
SCREW CORT 2.7X16 STAR T8 EVOS (Screw) ×2 IMPLANT
SCREW CORT 3.5X14 ST EVOS (Screw) ×1 IMPLANT
SCREW CORT EVOS ST 3.5X12 (Screw) ×2 IMPLANT
SCREW CORT ST EVOS 3.5X55 (Screw) ×1 IMPLANT
SCREW CORTICAL NCB 5.0X65 (Screw) ×1 IMPLANT
SCREW CTX 3.5X50MM EVOS (Screw) ×1 IMPLANT
SCREW NCB 4.0MX38M (Screw) ×2 IMPLANT
SCREW NCB 5.0X36MM (Screw) ×2 IMPLANT
SCREW NCB 5.0X38 (Screw) ×2 IMPLANT
SET HNDPC FAN SPRY TIP SCT (DISPOSABLE) IMPLANT
SPONGE T-LAP 18X18 ~~LOC~~+RFID (SPONGE) ×2 IMPLANT
STAPLER VISISTAT 35W (STAPLE) ×3 IMPLANT
STRIP CLOSURE SKIN 1/2X4 (GAUZE/BANDAGES/DRESSINGS) ×2 IMPLANT
SUCTION FRAZIER HANDLE 10FR (MISCELLANEOUS) ×2
SUCTION TUBE FRAZIER 10FR DISP (MISCELLANEOUS) ×2 IMPLANT
SUT ETHILON 3 0 PS 1 (SUTURE) ×9 IMPLANT
SUT MNCRL AB 3-0 PS2 18 (SUTURE) ×1 IMPLANT
SUT MNCRL AB 3-0 PS2 27 (SUTURE) ×2 IMPLANT
SUT MON AB 2-0 CT1 36 (SUTURE) ×2 IMPLANT
SUT PDS AB 0 CT1 27 (SUTURE) ×1 IMPLANT
SUT PROLENE 0 CT (SUTURE) IMPLANT
SUT VIC AB 0 CT1 27 (SUTURE) ×2
SUT VIC AB 0 CT1 27XBRD ANBCTR (SUTURE) ×2 IMPLANT
SUT VIC AB 1 CT1 27 (SUTURE)
SUT VIC AB 1 CT1 27XBRD ANBCTR (SUTURE) IMPLANT
SUT VIC AB 2-0 CT1 (SUTURE) ×1 IMPLANT
SUT VIC AB 2-0 CT1 27 (SUTURE) ×2
SUT VIC AB 2-0 CT1 TAPERPNT 27 (SUTURE) ×4 IMPLANT
SUT VIC AB 2-0 CT2 27 (SUTURE) ×1 IMPLANT
SYR CONTROL 10ML LL (SYRINGE) ×3 IMPLANT
TOWEL GREEN STERILE (TOWEL DISPOSABLE) ×6 IMPLANT
TOWEL GREEN STERILE FF (TOWEL DISPOSABLE) ×3 IMPLANT
TRAY FOLEY MTR SLVR 16FR STAT (SET/KITS/TRAYS/PACK) IMPLANT
UNDERPAD 30X36 HEAVY ABSORB (UNDERPADS AND DIAPERS) ×3 IMPLANT

## 2021-06-18 NOTE — Transfer of Care (Signed)
Immediate Anesthesia Transfer of Care Note  Patient: Danielle Goodman  Procedure(s) Performed: OPEN REDUCTION INTERNAL FIXATION (ORIF) DISTAL FEMUR FRACTURE (Left) OPEN REDUCTION INTERNAL FIXATION (ORIF) ANKLE FRACTURE (Right: Ankle)  Patient Location: PACU  Anesthesia Type:General  Level of Consciousness: awake and alert   Airway & Oxygen Therapy: Patient Spontanous Breathing  Post-op Assessment: Report given to RN and Post -op Vital signs reviewed and stable  Post vital signs: Reviewed and stable  Last Vitals:  Vitals Value Taken Time  BP 118/94 06/18/21 1410  Temp    Pulse 111 06/18/21 1413  Resp 24 06/18/21 1413  SpO2 96 % 06/18/21 1413  Vitals shown include unvalidated device data.  Last Pain:  Vitals:   06/18/21 1042  TempSrc: Axillary  PainSc: 7       Patients Stated Pain Goal: 0 (06/18/21 1042)  Complications: No notable events documented.
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
## 2021-06-18 NOTE — TOC CAGE-AID Note (Signed)
Transition of Care [facility]) - CAGE-AID Screening   Patient Details  Name: Lanesha Azzaro MRN: [id number] Date of Birth: 08-Nov-1995  Transition of Care [facility]) CM/SW Contact:    Kroy Sprung C Tarpley-Carter, LCSWA Phone Number: 06/18/2021, 11:33 AM   Clinical Narrative: Pt is unable to participate in Cage Aid. Pt is currently in surgery.  CSW will assess at a better time.    Xzavior Reinig Tarpley-Carter, MSW, LCSW-A Pronouns:  She/Her/Hers [place] Transitions of Care Clinical Social Worker Direct Number:  [phone] [email or web address]  CAGE-AID Screening: Substance Abuse Screening unable to be completed due to: : Patient unable to participate (Pt is currently in surgery.  CSW will assess at a better time.)             Substance Abuse Education Offered: Yes  Substance abuse interventions: Transport planner

## 2021-06-18 NOTE — Anesthesia Preprocedure Evaluation (Addendum)
Anesthesia Evaluation  Patient identified by MRN, date of birth, ID band Patient awake    Reviewed: Allergy & Precautions, NPO status , Patient's Chart, lab work & pertinent test results  History of Anesthesia Complications Negative for: history of anesthetic complications  Airway Mallampati: II  TM Distance: >3 FB Neck ROM: Full    Dental   Pulmonary asthma , former smoker,    breath sounds clear to auscultation       Cardiovascular negative cardio ROS   Rhythm:Regular Rate:Normal     Neuro/Psych  Headaches, PSYCHIATRIC DISORDERS Anxiety    GI/Hepatic negative GI ROS, Neg liver ROS,   Endo/Other  negative endocrine ROS  Renal/GU negative Renal ROS     Musculoskeletal negative musculoskeletal ROS (+)   Abdominal   Peds  Hematology negative hematology ROS (+)   Anesthesia Other Findings   Reproductive/Obstetrics                            Anesthesia Physical Anesthesia Plan  ASA: 3  Anesthesia Plan: General   Post-op Pain Management: Tylenol PO (pre-op) and Celebrex PO (pre-op)   Induction: Intravenous  PONV Risk Score and Plan: 4 or greater and Ondansetron, Dexamethasone, Midazolam and Scopolamine patch - Pre-op  Airway Management Planned: Oral ETT  Additional Equipment:   Intra-op Plan:   Post-operative Plan: Extubation in OR  Informed Consent:   Plan Discussed with: Anesthesiologist  Anesthesia Plan Comments:         Anesthesia Quick Evaluation
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
## 2021-06-18 NOTE — Anesthesia Procedure Notes (Signed)
Procedure Name: Intubation Date/Time: 06/18/2021 11:18 AM Performed by: Dorann Lodge, CRNA Pre-anesthesia Checklist: Patient identified, Emergency Drugs available, Suction available and Patient being monitored Patient Re-evaluated:Patient Re-evaluated prior to induction Oxygen Delivery Method: Circle System Utilized Preoxygenation: Pre-oxygenation with 100% oxygen Induction Type: IV induction Ventilation: Mask ventilation without difficulty Laryngoscope Size: Mac and 4 Grade View: Grade I Tube type: Oral Tube size: 7.0 mm Number of attempts: 1 Airway Equipment and Method: Stylet Placement Confirmation: ETT inserted through vocal cords under direct vision, positive ETCO2 and breath sounds checked- equal and bilateral Secured at: 22 cm Tube secured with: Tape Dental Injury: Teeth and Oropharynx as per pre-operative assessment
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
## 2021-06-18 NOTE — ED Notes (Signed)
Ortho tech at bedside 
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
## 2021-06-19 LAB — CBC
HCT: 26.8 % — ABNORMAL LOW (ref 36.0–46.0)
Hemoglobin: 9.1 g/dL — ABNORMAL LOW (ref 12.0–15.0)
MCH: 30.3 pg (ref 26.0–34.0)
MCHC: 34 g/dL (ref 30.0–36.0)
MCV: 89.3 fL (ref 80.0–100.0)
Platelets: 304 10*3/uL (ref 150–400)
RBC: 3 MIL/uL — ABNORMAL LOW (ref 3.87–5.11)
RDW: 12.9 % (ref 11.5–15.5)
WBC: 13.8 10*3/uL — ABNORMAL HIGH (ref 4.0–10.5)
nRBC: 0 % (ref 0.0–0.2)

## 2021-06-19 LAB — BASIC METABOLIC PANEL
Anion gap: 10 (ref 5–15)
BUN: 5 mg/dL — ABNORMAL LOW (ref 6–20)
CO2: 24 mmol/L (ref 22–32)
Calcium: 8.5 mg/dL — ABNORMAL LOW (ref 8.9–10.3)
Chloride: 103 mmol/L (ref 98–111)
Creatinine, Ser: 0.54 mg/dL (ref 0.44–1.00)
GFR, Estimated: >60 mL/min (ref >60)
Glucose, Bld: 129 mg/dL — ABNORMAL HIGH (ref 70–99)
Potassium: 3.9 mmol/L (ref 3.5–5.1)
Sodium: 137 mmol/L (ref 135–145)
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
## 2021-06-19 NOTE — Progress Notes (Signed)
° ° ° °  Subjective:  Patient reports pain as well controlled this morning. Eager to work with PT today. Foley dc'd this morning. No acute issues.  Objective:   VITALS:   Vitals:   06/18/21 1541 06/18/21 1943 06/19/21 0007 06/19/21 0355  BP: 137/66 125/78 121/75 135/79  Pulse: 81 (!) 110 95 (!) 102  Resp: 18 16 18 16   Temp: 98 F (36.7 C) 97.9 F (36.6 C) 97.6 F (36.4 C) 97.9 F (36.6 C)  TempSrc:  Oral Oral Oral  SpO2: 95% 95% 97% 97%  Weight:      Height:        Dressings c/d/I, boot to RLE  RLE: wiggles exposed toes, wwp, brisk cap refill LLE: intact EHL, FHL, SILT dorsal,plantar foot Foot ww/p  Lab Results  Component Value Date   WBC 13.8 (H) 06/19/2021   HGB 9.1 (L) 06/19/2021   HCT 26.8 (L) 06/19/2021   MCV 89.3 06/19/2021   PLT 304 06/19/2021   BMET    Component Value Date/Time   NA 137 06/19/2021 0313   K 3.9 06/19/2021 0313   CL 103 06/19/2021 0313   CO2 24 06/19/2021 0313   GLUCOSE 129 (H) 06/19/2021 0313   BUN 5 (L) 06/19/2021 0313   CREATININE 0.54 06/19/2021 0313   CALCIUM 8.5 (L) 06/19/2021 0313   GFRNONAA >60 06/19/2021 0313    Assessment/Plan: 1 Day Post-Op   Left open supracondylar/intracondylar distal femur with shaft extension Right closed bimalleolar ankle fracture   S/p  I&D and Open reduction internal fixation of left open distal femur fx, Repair of left quadriceps tear 2. Open reduction internal fixation of right bimalleolar ankle fracture On 2/10 with Dr. 4/10  Post op recs: WB: NWB LLE, WBAT RLE for transfers only Abx: abx per open fx protocol Imaging: PACU xrays Dressing: keep intact until follow up, change PRN if soiled or saturated. DVT prophylaxis: lovenox starting POD1 x4 weeks Follow up: follow up post discharge with Dr. Jena Gauss A Whitman Meinhardt 06/19/2021, 7:59 AM   08/17/2021, MD  Contact information:   [phone] 7am-5pm epic message Dr. [id number], or call office for patient follow up: [phone]  [phone] After hours and holidays please check [email or web address] for group call information for [facility]

## 2021-06-19 NOTE — Progress Notes (Signed)
Pt requested for her inhaler and was informed there's no order for an inhaler. Pt informs RN she's asthmatic and has an inhaler she uses. No order for an inhaler seen and PA on call for [facility] paged and notified. Pt oxygen saturation checked to be 100% RA. Pt assessed not be in any distress or sob. No response or new order received yet. Reported off to oncoming RN and pt informed. Delia Heady RN
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
## 2021-06-20 LAB — CBC
HCT: 22.8 % — ABNORMAL LOW (ref 36.0–46.0)
Hemoglobin: 7.6 g/dL — ABNORMAL LOW (ref 12.0–15.0)
MCH: 30 pg (ref 26.0–34.0)
MCHC: 33.3 g/dL (ref 30.0–36.0)
MCV: 90.1 fL (ref 80.0–100.0)
Platelets: 297 10*3/uL (ref 150–400)
RBC: 2.53 MIL/uL — ABNORMAL LOW (ref 3.87–5.11)
RDW: 13.2 % (ref 11.5–15.5)
WBC: 8.1 10*3/uL (ref 4.0–10.5)
nRBC: 0 % (ref 0.0–0.2)

## 2021-06-20 LAB — BASIC METABOLIC PANEL
Anion gap: 10 (ref 5–15)
BUN: 5 mg/dL — ABNORMAL LOW (ref 6–20)
CO2: 26 mmol/L (ref 22–32)
Calcium: 8.2 mg/dL — ABNORMAL LOW (ref 8.9–10.3)
Chloride: 99 mmol/L (ref 98–111)
Creatinine, Ser: 0.55 mg/dL (ref 0.44–1.00)
GFR, Estimated: >60 mL/min (ref >60)
Glucose, Bld: 101 mg/dL — ABNORMAL HIGH (ref 70–99)
Potassium: 3.1 mmol/L — ABNORMAL LOW (ref 3.5–5.1)
Sodium: 135 mmol/L (ref 135–145)

## 2021-06-20 MED ORDER — ACETAMINOPHEN 500 MG PO TABS
1000.0000 mg | ORAL_TABLET | Freq: Four times a day (QID) | ORAL | Status: DC
  Administered 2021-06-20 – 2021-06-23 (×12): 1000 mg via ORAL
  Filled 2021-06-20 (×13): qty 2

## 2021-06-20 MED ORDER — ALBUTEROL SULFATE (2.5 MG/3ML) 0.083% IN NEBU: 3.0000 mL | INHALATION_SOLUTION | Freq: Four times a day (QID) | RESPIRATORY_TRACT | Status: DC | PRN

## 2021-06-20 MED ORDER — TRAMADOL HCL 50 MG PO TABS: 50.0000 mg | ORAL_TABLET | Freq: Four times a day (QID) | ORAL | Status: DC

## 2021-06-20 MED ORDER — TRAMADOL HCL 50 MG PO TABS
50.0000 mg | ORAL_TABLET | Freq: Four times a day (QID) | ORAL | Status: DC
  Administered 2021-06-20 – 2021-06-23 (×12): 50 mg via ORAL
  Filled 2021-06-20 (×13): qty 1

## 2021-06-20 NOTE — Progress Notes (Signed)
° ° °  Subjective: Patient reports pain as marked. Tolerating diet.  Urinating. CP from contusion. No SOB.  Working with PT on mobilizing OOB while maintaining WB status. States she is nervous to go home as her mom wouldn't really be able to lift her if she fell.  Objective:   VITALS:   Vitals:   06/19/21 1625 06/19/21 1930 06/20/21 0319 06/20/21 0751  BP: 122/72 121/67 117/65 111/66  Pulse: (!) 109 (!) 103 95 (!) 107  Resp: 18 18 16 17   Temp: 97.8 F (36.6 C) 97.9 F (36.6 C) 98 F (36.7 C) 98.5 F (36.9 C)  TempSrc:  Oral Oral Oral  SpO2: 100% 100% 100% 100%  Weight:      Height:       CBC Latest Ref Rng & Units 06/20/2021 06/19/2021 06/18/2021  WBC 4.0 - 10.5 K/uL 8.1 13.8(H) 15.1(H)  Hemoglobin 12.0 - 15.0 g/dL 7.6(L) 9.1(L) 10.8(L)  Hematocrit 36.0 - 46.0 % 22.8(L) 26.8(L) 32.7(L)  Platelets 150 - 400 K/uL 297 304 384   BMP Latest Ref Rng & Units 06/20/2021 06/19/2021 06/18/2021  Glucose 70 - 99 mg/dL 101(H) 129(H) -  BUN 6 - 20 mg/dL 5(L) 5(L) -  Creatinine 0.44 - 1.00 mg/dL 0.55 0.54 0.49  Sodium 135 - 145 mmol/L 135 137 -  Potassium 3.5 - 5.1 mmol/L 3.1(L) 3.9 -  Chloride 98 - 111 mmol/L 99 103 -  CO2 22 - 32 mmol/L 26 24 -  Calcium 8.9 - 10.3 mg/dL 8.2(L) 8.5(L) -   Intake/Output      02/11 0701 02/12 0700 02/12 0701 02/13 0700   P.O. 240    I.V. (mL/kg)     IV Piggyback 150    Total Intake(mL/kg) 390 (5.2)    Urine (mL/kg/hr) 1245 (0.7)    Stool     Blood     Total Output 1245    Net -855         Urine Occurrence 2 x       Physical Exam: General: NAD.  Laying in bed, calm, talkative Resp: No increased wob Cardio: regular rate and rhythm ABD soft Neurologically intact MSK Neurovascularly intact Sensation intact distally Intact pulses distally Dorsiflexion/Plantar flexion intact Incisions: dressings C/D/I CAM boot to right ankle   Assessment: 2 Days Post-Op  S/P Procedure(s) (LRB): OPEN REDUCTION INTERNAL FIXATION (ORIF) DISTAL FEMUR FRACTURE  (Left) OPEN REDUCTION INTERNAL FIXATION (ORIF) ANKLE FRACTURE (Right) by Dr. Doreatha Martin on 06/18/21  Principal Problem:   Other fracture of left femur, initial encounter for closed fracture [facility])   Plan: Patient requested an inhaler. Says she has asthma and uses an inhaler at home PRN. She claims a few episodes of wheezing while inpatient. Currently breathing ok. No mention in chart of inhaler but I'll order one to use PRN.   Advance diet Up with therapy Incentive Spirometry Elevate and Apply ice  Weightbearing: NWB LLE but unrestricted L knee ROM ok; WBAT RLE for transfers only Insicional and dressing care: Dressings left intact until follow-up and Reinforce dressings as needed Orthopedic device(s): CAM boot Showering: Keep dressing dry VTE prophylaxis: Lovenox 40mg  qd , SCDs, ambulation Pain control: Oxy and Dilaudid PRN, will add Tylenol and Tramadol to decrease heavy narcotic use Follow - up plan: 2 weeks with Dr. Doreatha Martin   Dispo:  PT/OT recommending [place], PA-C Office [phone] 06/20/2021, 11:13 AM

## 2021-06-20 NOTE — Progress Notes (Signed)
Pt. states she is having to bear down to void, she is able to empty her bladder but is concerned with the amount of force she is having to use to be able to void.
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
## 2021-06-21 DIAGNOSIS — S72452B Displaced supracondylar fracture without intracondylar extension of lower end of left femur, initial encounter for open fracture type I or II: Secondary | ICD-10-CM

## 2021-06-21 DIAGNOSIS — S82841A Displaced bimalleolar fracture of right lower leg, initial encounter for closed fracture: Secondary | ICD-10-CM

## 2021-06-21 LAB — BASIC METABOLIC PANEL
Anion gap: 8 (ref 5–15)
BUN: 6 mg/dL (ref 6–20)
CO2: 29 mmol/L (ref 22–32)
Calcium: 8.5 mg/dL — ABNORMAL LOW (ref 8.9–10.3)
Chloride: 97 mmol/L — ABNORMAL LOW (ref 98–111)
Creatinine, Ser: 0.47 mg/dL (ref 0.44–1.00)
GFR, Estimated: >60 mL/min (ref >60)
Glucose, Bld: 96 mg/dL (ref 70–99)
Potassium: 2.9 mmol/L — ABNORMAL LOW (ref 3.5–5.1)
Sodium: 134 mmol/L — ABNORMAL LOW (ref 135–145)

## 2021-06-21 LAB — CBC
HCT: 22.4 % — ABNORMAL LOW (ref 36.0–46.0)
Hemoglobin: 7.2 g/dL — ABNORMAL LOW (ref 12.0–15.0)
MCH: 29.9 pg (ref 26.0–34.0)
MCHC: 32.1 g/dL (ref 30.0–36.0)
MCV: 92.9 fL (ref 80.0–100.0)
Platelets: 310 10*3/uL (ref 150–400)
RBC: 2.41 MIL/uL — ABNORMAL LOW (ref 3.87–5.11)
RDW: 13.2 % (ref 11.5–15.5)
WBC: 7 10*3/uL (ref 4.0–10.5)
nRBC: 0 % (ref 0.0–0.2)

## 2021-06-21 MED ORDER — POTASSIUM CHLORIDE CRYS ER 20 MEQ PO TBCR
20.0000 meq | EXTENDED_RELEASE_TABLET | Freq: Two times a day (BID) | ORAL | Status: DC
  Administered 2021-06-21 – 2021-06-23 (×4): 20 meq via ORAL
  Filled 2021-06-21 (×4): qty 1

## 2021-06-21 MED ORDER — VITAMIN D 25 MCG (1000 UNIT) PO TABS
2000.0000 [IU] | ORAL_TABLET | Freq: Every day | ORAL | Status: DC
  Administered 2021-06-21 – 2021-06-23 (×3): 2000 [IU] via ORAL
  Filled 2021-06-21 (×3): qty 2
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
## 2021-06-21 NOTE — Anesthesia Postprocedure Evaluation (Signed)
Anesthesia Post Note  Patient: Danielle Goodman  Procedure(s) Performed: OPEN REDUCTION INTERNAL FIXATION (ORIF) DISTAL FEMUR FRACTURE (Left) OPEN REDUCTION INTERNAL FIXATION (ORIF) ANKLE FRACTURE (Right: Ankle)     Patient location during evaluation: PACU Anesthesia Type: General Level of consciousness: awake and alert Pain management: pain level controlled Vital Signs Assessment: post-procedure vital signs reviewed and stable Respiratory status: spontaneous breathing, nonlabored ventilation, respiratory function stable and patient connected to nasal cannula oxygen Cardiovascular status: blood pressure returned to baseline and stable Postop Assessment: no apparent nausea or vomiting Anesthetic complications: no   No notable events documented.  Last Vitals:  Vitals:   06/20/21 2346 06/21/21 0400  BP: 114/63   Pulse: (!) 102 (!) 101  Resp: 17 18  Temp: 36.5 C 37.1 C  SpO2: 98% 98%    Last Pain:  Vitals:   06/21/21 0554  TempSrc:   PainSc: 7                  Kennieth Rad

## 2021-06-21 NOTE — Progress Notes (Signed)
Mobility Specialist: Progress Note   06/21/21 1704  Mobility  Activity Stood at bedside  Level of Assistance Minimal assist, patient does 75% or more  Assistive Device Front wheel walker  RLE Weight Bearing WBAT  LLE Weight Bearing NWB  Activity Response Tolerated well  $Mobility charge 1 Mobility   Pt required minA for bed mobility to move LLE to EOB as well as to stand. C/o 7/10 pain in her LLE when standing. Able to stand for 3 bouts and transfer to/from [facility]. Pt back to bed with call bell and phone at her side.   [facility] Danielle Goodman Mobility Specialist Mobility Specialist 5 North: [phone] Mobility Specialist 6 North: [phone]
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
## 2021-06-21 NOTE — Progress Notes (Signed)
Orthopaedic Trauma Progress Note  SUBJECTIVE: Doing ok today, didn't sleep very well last night. Pain controlled. Left knee bothering her more than right ankle. Also notes some having some continued difficulty with urination. Notes she has the urge to go only able to void small volumes. No chest pain. No SOB. No nausea/vomiting. No other complaints. Nervous about going home.   OBJECTIVE:  Vitals:   06/21/21 0400 06/21/21 0855  BP:  127/81  Pulse: (!) 101 86  Resp: 18 18  Temp: 98.7 F (37.1 C) 98.8 F (37.1 C)  SpO2: 98% 100%    General: Sitting up in bed, NAD Respiratory: No increased work of breathing.  RLE: Dressing changed, incisions stable. Bruising about the knee as expected. Tolerates small amount of knee motion. Ankle DF/PF intact. Skin warm and dry. Compartments soft and compressible. +DP pulse LLE:  Dressing changed. Incisions CDI with steri-strips in place. Bruising over dorsum of foot. Swelling stable. Able to wiggle toes. Tolerates gentle ankle motion. Endorses sensation to light touch over foot, slightly diminished over dorsal surface compared to plantar surface. +DP pulse  IMAGING: Stable post op imaging.   LABS:  Results for orders placed or performed during the hospital encounter of 06/17/21 (from the past 24 hour(s))  Basic metabolic panel     Status: Abnormal   Collection Time: 06/21/21  7:06 AM  Result Value Ref Range   Sodium 134 (L) 135 - 145 mmol/L   Potassium 2.9 (L) 3.5 - 5.1 mmol/L   Chloride 97 (L) 98 - 111 mmol/L   CO2 29 22 - 32 mmol/L   Glucose, Bld 96 70 - 99 mg/dL   BUN 6 6 - 20 mg/dL   Creatinine, Ser 4.09 0.44 - 1.00 mg/dL   Calcium 8.5 (L) 8.9 - 10.3 mg/dL   GFR, Estimated >73 >53 mL/min   Anion gap 8 5 - 15    ASSESSMENT: Danielle Goodman is a 26 y.o. female, 3 Days Post-Op s/p ORIF LEFT DISTAL FEMUR FRACTURE ORIF RIGHT ANKLE FRACTURE  CV/Blood loss: Acute blood loss anemia, Hgb 7.6 on 06/20/21. Recheck today. Hemodynamically  stable  PLAN: Weightbearing:  - RLE: WB for transfers - LLE: NWB ROM: - RLE: ok for ankle ROM as tolerated at rest - LLE: unrestricted knee and ankle ROM Incisional and dressing care:  Changed today. Continue to change PRN Showering: Ok to begin showering and getting incisions wet 06/22/21 Orthopedic device(s): CAM boot RLE Pain management:  1. Tylenol 1000 mg q 6 hours scheduled 2. Robaxin 500 mg q 6 hours PRN 3. Oxycodone 5-10 mg q 4 hours PRN 4. Dilaudid 0.5-1 mg q 4 hours PRN 5. Tramadol 50 mg q 6 hours VTE prophylaxis: Lovenox, SCDs ID:  Ancef 2gm post op per open fracture protocol Foley/Lines:  No foley, KVO IVFs Impediments to Fracture Healing: Vit D level 16, started on supplementation Dispo: Bladder scan now, if >400 mL please perform I&O cath. Recheck CBC today.  Continue therapies as tolerated, PT/OT recommending HH.  Tentative plan will be d/c home Wednesday 06/23/21  D/C recommendations: - Oxycodone for pain control - Eliquis 2.5 mg BID x 30 days for DVT prophylaxis - 2000 units Vit D supplementation daily x 90 days  Follow - up plan: 2 weeks   Contact information:  Truitt Merle MD, Thyra Breed PA-C. After hours and holidays please check [email or web address] for group call information for [facility]   Thompson Caul, PA-C [phone] (office) [email or web address]

## 2021-06-22 ENCOUNTER — Encounter (HOSPITAL_COMMUNITY): Payer: Self-pay | Admitting: Student

## 2021-06-22 LAB — CBC
HCT: 22.8 % — ABNORMAL LOW (ref 36.0–46.0)
Hemoglobin: 7.6 g/dL — ABNORMAL LOW (ref 12.0–15.0)
MCH: 30.5 pg (ref 26.0–34.0)
MCHC: 33.3 g/dL (ref 30.0–36.0)
MCV: 91.6 fL (ref 80.0–100.0)
Platelets: 377 10*3/uL (ref 150–400)
RBC: 2.49 MIL/uL — ABNORMAL LOW (ref 3.87–5.11)
RDW: 13 % (ref 11.5–15.5)
WBC: 6.6 10*3/uL (ref 4.0–10.5)
nRBC: 0 % (ref 0.0–0.2)

## 2021-06-22 MED ORDER — TAMSULOSIN HCL 0.4 MG PO CAPS
0.4000 mg | ORAL_CAPSULE | Freq: Every day | ORAL | Status: DC
  Administered 2021-06-22 – 2021-06-23 (×2): 0.4 mg via ORAL
  Filled 2021-06-22 (×2): qty 1

## 2021-06-22 NOTE — Progress Notes (Signed)
Orthopaedic Trauma Progress Note  SUBJECTIVE: Doing ok today.  Continues to have pain in bilateral lower extremities but notes that pain medications are helping.  Left knee bothering her more than right ankle. Also notes some having some continued difficulty with urination.  In and out cath done yesterday which only yielded 50 mL.  Notes she has the urge to go only able to void small volumes.  I have started Flomax this morning.  Patient also noted to have some left-sided chest discomfort earlier today.  Denies any shortness of breath, wheezing, indigestion.  The pain in her chest has been present since admission but she feels like this morning it was worse.  EKG done at bedside was normal.  No nausea/vomiting. No other complaints. Nervous about going home.   Nursing noted that patient's IV is red and site is tender nursing noted tenderness and redness over IV site.  We will remove IV today and try to manage pain with oral medications only in anticipation of discharge home later this week  OBJECTIVE:  Vitals:   06/22/21 0739 06/22/21 0739  BP: 120/69 120/69  Pulse: 100   Resp: 17   Temp: 98.1 F (36.7 C)   SpO2: 99%     General: Sitting up in bed, NAD Respiratory: No increased work of breathing.  Chest: Seatbelt sign.  Tenderness over both the right and left chest wall. RLE: Dressings stable. Bruising about the knee as expected. Tolerates small amount of knee motion. Ankle DF/PF intact. Skin warm and dry. Compartments soft and compressible. +DP pulse LLE: Cam boot in place.  Able to wiggle toes. Tolerates gentle ankle motion. Endorses sensation to light touch over foot, slightly diminished over dorsal surface compared to plantar surface. +DP pulse   IMAGING: Stable post op imaging.   LABS:  Results for orders placed or performed during the hospital encounter of 06/17/21 (from the past 24 hour(s))  CBC     Status: Abnormal   Collection Time: 06/22/21  3:18 AM  Result Value Ref Range    WBC 6.6 4.0 - 10.5 K/uL   RBC 2.49 (L) 3.87 - 5.11 MIL/uL   Hemoglobin 7.6 (L) 12.0 - 15.0 g/dL   HCT 71.0 (L) 62.6 - 94.8 %   MCV 91.6 80.0 - 100.0 fL   MCH 30.5 26.0 - 34.0 pg   MCHC 33.3 30.0 - 36.0 g/dL   RDW 54.6 27.0 - 35.0 %   Platelets 377 150 - 400 K/uL   nRBC 0.0 0.0 - 0.2 %    ASSESSMENT: Danielle Goodman is a 26 y.o. female, 4 Days Post-Op s/p ORIF LEFT DISTAL FEMUR FRACTURE ORIF RIGHT ANKLE FRACTURE  CV/Blood loss: Acute blood loss anemia, Hgb 7.6 this morning.  Hemodynamically stable  PLAN: Weightbearing:  - RLE: WB for transfers - LLE: NWB ROM: - RLE: ok for ankle ROM as tolerated at rest - LLE: unrestricted knee and ankle ROM Incisional and dressing care: Changed 06/21/2021.  Continue to change PRN Showering: Ok to begin showering and getting incisions wet 06/22/21 Orthopedic device(s): CAM boot RLE when ambulating.  May come out of boot at rest. Pain management:  1. Tylenol 1000 mg q 6 hours scheduled 2. Robaxin 500 mg q 6 hours PRN 3. Oxycodone 5-10 mg q 4 hours PRN 4. Dilaudid 0.5-1 mg q 4 hours PRN 5. Tramadol 50 mg q 6 hours VTE prophylaxis: Lovenox, SCDs ID:  Ancef 2gm post op per open fracture protocol completed Foley/Lines:  No foley, KVO IVFs  Impediments to Fracture Healing: Vit D level 16, started on supplementation Dispo: Chest pain likely muscular.  We will continue to monitor this.  Continue therapies as tolerated, PT/OT recommending HH but insurance will not cover this.  We will plan for outpatient therapy.  DME orders completed.  Tentative plan will be d/c home Wednesday 06/23/21  D/C recommendations: - Oxycodone for pain control - Eliquis 2.5 mg BID x 30 days for DVT prophylaxis - 2000 units Vit D supplementation daily x 90 days  Follow - up plan: 2 weeks after discharge for repeat x-rays and wound check   Contact information:  Truitt Merle MD, Thyra Breed PA-C. After hours and holidays please check [email or web address] for group call information  for [facility]   Thompson Caul, PA-C [phone] (office) [email or web address]

## 2021-06-22 NOTE — TOC CAGE-AID Note (Signed)
Transition of Care [facility]) - CAGE-AID Screening   Patient Details  Name: Danielle Goodman MRN: [id number] Date of Birth: [birth year]  Transition of Care [facility]) CM/SW Contact:    Gaetano Hawthorne Tarpley-Carter, [place] Phone Number: 06/22/2021, 2:35 PM   Clinical Narrative: Pt participated in [place].  Pt stated she does not use substance, she does drink ETOH socially.  Pt was offered resources, due to usage of ETOH.     Kiven Vangilder Tarpley-Carter, MSW, LCSW-A Pronouns:  She/Her/Hers [place] Transitions of Care Clinical Social Worker Direct Number:  [phone] [email or web address]   CAGE-AID Screening: Substance Abuse Screening unable to be completed due to: : Patient unable to participate (Pt is currently in surgery.  CSW will assess at a better time.)  Have You Ever Felt You Ought to Cut Down on Your Drinking or Drug Use?: No Have People Annoyed You By Critizing Your Drinking Or Drug Use?: No Have You Felt Bad Or Guilty About Your Drinking Or Drug Use?: No Have You Ever Had a Drink or Used Drugs First Thing In The Morning to Steady Your Nerves or to Get Rid of a Hangover?: No CAGE-AID Score: 0  Substance Abuse Education Offered: Yes  Substance abuse interventions: Scientist, clinical (histocompatibility and immunogenetics)

## 2021-06-22 NOTE — TOC Progression Note (Addendum)
Transition of Care [facility]) - Progression Note    Patient Details  Name: Danielle Goodman MRN: [id number] Date of Birth: 05/03/1996  Transition of Care [facility]) CM/SW Contact  Beckie Busing, RN Phone Number:[phone]  06/22/2021, 9:40 AM  Clinical Narrative:    CM at bedside to discuss disposition needs. CM informed patient that her insurance will not cover HH. Patient is agreeable to outpatient therapy. Patient has recommendations for rolling walker, wheelchair and 3in 1. CM informed patient that insurance will not pay for wheelchair and walker. Patient has decided that she wants wheelchair and will provide her own walker. MD will need to enter DME and outpatient therapy orders.   1232 DME orders for wheelchair and 3in1 called to [employer] and will be delivered to the bedside.       Expected Discharge Plan and Services                                                 Social Determinants of Health (SDOH) Interventions    Readmission Risk Interventions No flowsheet data found.
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
## 2021-06-22 NOTE — Progress Notes (Addendum)
Mobility Specialist: Progress Note   06/22/21 1422  Mobility  Activity Transferred from chair to bed;Transferred to/from [facility]  Level of Assistance Minimal assist, patient does 75% or more  Assistive Device Front wheel walker  RLE Weight Bearing WBAT  LLE Weight Bearing NWB  Distance Ambulated (ft) 2 ft  Activity Response Tolerated well  $Mobility charge 1 Mobility   Pt received in chair and wishing to return to bed. Assisted pt to bed and then to [facility] per request, void successful. Pt back to bed from [facility] with call bell and phone at her side. C/o pain in her LLE and R ankle post transfer, no rating given.   [facility] Ifrah Vest Mobility Specialist Mobility Specialist 5 North: [phone] Mobility Specialist 6 North: [phone]

## 2021-06-23 ENCOUNTER — Other Ambulatory Visit (HOSPITAL_COMMUNITY): Payer: Self-pay

## 2021-06-23 MED ORDER — OXYCODONE-ACETAMINOPHEN 10-325 MG PO TABS
1.0000 | ORAL_TABLET | ORAL | 0 refills | Status: DC | PRN
Start: 2021-06-23 — End: 2023-03-16
  Filled 2021-06-23: qty 30, 5d supply, fill #0

## 2021-06-23 MED ORDER — METHOCARBAMOL 500 MG PO TABS
500.0000 mg | ORAL_TABLET | Freq: Four times a day (QID) | ORAL | 0 refills | Status: DC | PRN
  Filled 2021-06-23: qty 28, 7d supply, fill #0

## 2021-06-23 MED ORDER — APIXABAN 2.5 MG PO TABS
2.5000 mg | ORAL_TABLET | Freq: Two times a day (BID) | ORAL | 0 refills | Status: DC
  Filled 2021-06-23: qty 60, 30d supply, fill #0

## 2021-06-23 MED ORDER — POTASSIUM CHLORIDE CRYS ER 20 MEQ PO TBCR
20.0000 meq | EXTENDED_RELEASE_TABLET | Freq: Two times a day (BID) | ORAL | 0 refills | Status: DC
  Filled 2021-06-23: qty 20, 10d supply, fill #0

## 2021-06-23 MED ORDER — TAMSULOSIN HCL 0.4 MG PO CAPS
0.4000 mg | ORAL_CAPSULE | Freq: Every day | ORAL | 0 refills | Status: AC
  Filled 2021-06-23: qty 7, 7d supply, fill #0

## 2021-06-23 MED ORDER — ONDANSETRON HCL 4 MG PO TABS
4.0000 mg | ORAL_TABLET | Freq: Four times a day (QID) | ORAL | 0 refills | Status: DC | PRN
  Filled 2021-06-23: qty 20, 5d supply, fill #0

## 2021-06-23 MED ORDER — VITAMIN D3 25 MCG PO TABS
2000.0000 [IU] | ORAL_TABLET | Freq: Every day | ORAL | 2 refills | Status: AC
  Filled 2021-06-23: qty 60, 30d supply, fill #0
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
## 2021-06-23 NOTE — Discharge Instructions (Addendum)
[facility] Discharge Instructions   General Discharge Instructions  WEIGHT BEARING STATUS: Left leg - Non-weightbearing Right leg - Weightbearing for transfers only in the CAM boot  RANGE OF MOTION/ACTIVITY: Left leg - OK for knee motion as tolerated Right leg - OK to come out of boot at rest for ankle range of motion as tolerated  Wound Care: Incisions can be left open to air if there is no drainage. If incision continues to have drainage, follow wound care instructions below. Okay to shower if no drainage from incisions.  Left leg - Incisions are side of leg are closed with dissolvable suture and skin glue. The laceration on the front of your knee is closed with Nylon suture that will need to be removed in 2 weeks. Incisions may be left open to air. All incisions may get wet when showering.  Right leg - Incisions are closed with dissolvable suture and steri-strips. The steri-strips may get wet when showering. Pat dry after showering and sllow ankle to dry before applying new dressing.   DVT/PE prophylaxis:  Eliquis  Diet: as you were eating previously.  Can use over the counter stool softeners and bowel preparations, such as Miralax, to help with bowel movements.  Narcotics can be constipating.  Be sure to drink plenty of fluids  PAIN MEDICATION USE AND EXPECTATIONS  You have likely been given narcotic medications to help control your pain.  After a traumatic event that results in an fracture (broken bone) with or without surgery, it is ok to use narcotic pain medications to help control one's pain.  We understand that everyone responds to pain differently and each individual patient will be evaluated on a regular basis for the continued need for narcotic medications. Ideally, narcotic medication use should last no more than 6-8 weeks (coinciding with fracture healing).   As a patient it is your responsibility as well to monitor narcotic medication use and report the  amount and frequency you use these medications when you come to your office visit.   We would also advise that if you are using narcotic medications, you should take a dose prior to therapy to maximize you participation.  IF YOU ARE ON NARCOTIC MEDICATIONS IT IS NOT PERMISSIBLE TO OPERATE A MOTOR VEHICLE (MOTORCYCLE/CAR/TRUCK/MOPED) OR HEAVY MACHINERY DO NOT MIX NARCOTICS WITH OTHER CNS (CENTRAL NERVOUS SYSTEM) DEPRESSANTS SUCH AS ALCOHOL   STOP SMOKING OR USING NICOTINE PRODUCTS!!!!  As discussed nicotine severely impairs your body's ability to heal surgical and traumatic wounds but also impairs bone healing.  Wounds and bone heal by forming microscopic blood vessels (angiogenesis) and nicotine is a vasoconstrictor (essentially, shrinks blood vessels).  Therefore, if vasoconstriction occurs to these microscopic blood vessels they essentially disappear and are unable to deliver necessary nutrients to the healing tissue.  This is one modifiable factor that you can do to dramatically increase your chances of healing your injury.    (This means no smoking, no nicotine gum, patches, etc)  DO NOT USE NONSTEROIDAL ANTI-INFLAMMATORY DRUGS (NSAID'S)  Using products such as Advil (ibuprofen), Aleve (naproxen), Motrin (ibuprofen) for additional pain control during fracture healing can delay and/or prevent the healing response.  If you would like to take over the counter (OTC) medication, Tylenol (acetaminophen) is ok.  However, some narcotic medications that are given for pain control contain acetaminophen as well. Therefore, you should not exceed more than 4000 mg of tylenol in a day if you do not have liver disease.  Also note that  there are may OTC medicines, such as cold medicines and allergy medicines that my contain tylenol as well.  If you have any questions about medications and/or interactions please ask your doctor/PA or your pharmacist.      ICE AND ELEVATE INJURED/OPERATIVE EXTREMITY  Using ice and  elevating the injured extremity above your heart can help with swelling and pain control.  Icing in a pulsatile fashion, such as 20 minutes on and 20 minutes off, can be followed.    Do not place ice directly on skin. Make sure there is a barrier between to skin and the ice pack.    Using frozen items such as frozen peas works well as the conform nicely to the are that needs to be iced.  USE AN ACE WRAP OR TED HOSE FOR SWELLING CONTROL  In addition to icing and elevation, Ace wraps or TED hose are used to help limit and resolve swelling.  It is recommended to use Ace wraps or TED hose until you are informed to stop.    When using Ace Wraps start the wrapping distally (farthest away from the body) and wrap proximally (closer to the body)   Example: If you had surgery on your leg or thing and you do not have a splint on, start the ace wrap at the toes and work your way up to the thigh        If you had surgery on your upper extremity and do not have a splint on, start the ace wrap at your fingers and work your way up to the upper arm  IF YOU ARE IN A CAM BOOT (BLACK BOOT)  You may remove boot periodically. Perform daily dressing changes as noted below.  Wash the liner of the boot regularly and wear a sock when wearing the boot. It is recommended that you sleep in the boot until told otherwise   CALL THE OFFICE WITH ANY QUESTIONS OR CONCERNS: [phone]   VISIT OUR WEBSITE FOR ADDITIONAL INFORMATION: [email or web address]     Discharge Wound Care Instructions  Do NOT apply any ointments, solutions or lotions to pin sites or surgical wounds.  These prevent needed drainage and even though solutions like hydrogen peroxide kill bacteria, they also damage cells lining the pin sites that help fight infection.  Applying lotions or ointments can keep the wounds moist and can cause them to breakdown and open up as well. This can increase the risk for infection. When in doubt call the office.  If any  drainage is noted, use one layer of adaptic, then gauze, Kerlix, and an ace wrap.  Once the incision is completely dry and without drainage, it may be left open to air out.  Showering may begin 36-48 hours later.  Cleaning gently with soap and water.  Traumatic wounds should be dressed as needed --> use one layer of gauze, Kerlix, then ace wrap.  The adaptic can be discontinued once the draining has ceased

## 2021-06-23 NOTE — Progress Notes (Incomplete)
Patient needs a ride with safe transport. Her mom does not have a ride.
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
## 2021-06-23 NOTE — Plan of Care (Signed)
Patient is going home today with mom

## 2021-06-28 ENCOUNTER — Other Ambulatory Visit (HOSPITAL_COMMUNITY): Payer: Self-pay

## 2021-06-28 ENCOUNTER — Telehealth (HOSPITAL_COMMUNITY): Payer: Self-pay

## 2021-06-28 NOTE — Telephone Encounter (Signed)
Pharmacy Transitions of Care Follow-up Telephone Call  Date of discharge: 06/23/2021  Discharge Diagnosis: DVT Prophylaxis  How have you been since you were released from the hospital? Patient reports doing well and recovering. No questions or concerns at this time.    Medication changes made at discharge:  - START: Eliquis 2.5 mg PO BID  Medication changes verified by the patient? Yes (Yes/No)    Medication Accessibility:  Home Pharmacy: [facility] [place]    Was the patient provided with refills on discharged medications? No   Have all prescriptions been transferred from [facility] to home pharmacy? N/A   Is the patient able to afford medications? Patient has insurance  Medication Review:  APIXABAN (ELIQUIS)  Apixaban 2.5 mg BID  - Discussed importance of taking medication around the same time everyday  - Advised patient of medications to avoid (NSAIDs, ASA)  - Educated that Tylenol (acetaminophen) will be the preferred analgesic to prevent risk of bleeding  - Emphasized importance of monitoring for signs and symptoms of bleeding (abnormal bruising, prolonged bleeding, nose bleeds, bleeding from gums, discolored urine, black tarry stools)  - Advised patient to alert all providers of anticoagulation therapy prior to starting a new medication or having a procedure   Follow-up Appointments:  [facility] f/u appt confirmed? Scheduled to see Dr. Judee Clara on 02/28 @ 8:45.   If their condition worsens, is the pt aware to call PCP or go to the Emergency Dept.? Yes  Final Patient Assessment: Patient reports did not receive counseling on Eliquis therefore educated patient. No refills provided, per provider note plan was for 30 day therapy for DVT prophylaxis. [facility] No other questions or concerns at this time.

## 2021-07-01 NOTE — Therapy (Incomplete)
OUTPATIENT PHYSICAL THERAPY LOWER EXTREMITY EVALUATION   Patient Name: Danielle Goodman MRN: [id number] DOB:12-28-1995, 26 y.o., female Today's Date: 07/01/2021    Past Medical History:  Diagnosis Date   ADHD (attention deficit hyperactivity disorder)    Asthma    Headache    Migrainse with pregnancty   SVD (spontaneous vaginal delivery) 03/17/2016   Past Surgical History:  Procedure Laterality Date   ADENOIDECTOMY     LAPAROSCOPIC APPENDECTOMY N/A 02/12/2021   Procedure: APPENDECTOMY LAPAROSCOPIC;  Surgeon: Henrene Dodge, MD;  Location: [facility] ORS;  Service: General;  Laterality: N/A;   ORIF ANKLE FRACTURE Right 06/18/2021   Procedure: OPEN REDUCTION INTERNAL FIXATION (ORIF) ANKLE FRACTURE;  Surgeon: Roby Lofts, MD;  Location: [facility] OR;  Service: Orthopedics;  Laterality: Right;   ORIF FEMUR FRACTURE Left 06/18/2021   Procedure: OPEN REDUCTION INTERNAL FIXATION (ORIF) DISTAL FEMUR FRACTURE;  Surgeon: Roby Lofts, MD;  Location: [facility] OR;  Service: Orthopedics;  Laterality: Left;   TONSILLECTOMY     Patient Active Problem List   Diagnosis Date Noted   Ankle fracture, bimalleolar, closed, right, initial encounter 06/21/2021   Open supracondylar fracture of femur, left, type I or II, initial encounter (HCC) 06/21/2021   Other fracture of left femur, initial encounter for closed fracture (HCC) 06/17/2021   Alcohol intoxication (HCC) 06/07/2021   Acute appendicitis 02/12/2021   Normal labor and delivery 03/17/2016   SVD (spontaneous vaginal delivery) 03/17/2016   Premature rupture of membranes 07/10/2013    [facility]P: [facility]  REFERRING PROVIDER: West Bali, PA-C  REFERRING DIAG:  Open supracondylar fracture of femur, left, type I or II, initial encounter Ankle fracture, bimalleolar, closed, right, initial encounter  THERAPY DIAG:  No diagnosis found.  ONSET DATE: ***  SUBJECTIVE:   SUBJECTIVE STATEMENT: ***  PERTINENT HISTORY: ***  PAIN:  Are  you having pain? {yes/no:20286} NPRS scale: ***/10 Pain location: *** Pain orientation: {Pain Orientation:25161}  PAIN TYPE: {type:313116} Pain description: {PAIN DESCRIPTION:21022940}  Aggravating factors: *** Relieving factors: ***  PRECAUTIONS: {Therapy precautions:24002}  WEIGHT BEARING RESTRICTIONS {Yes ***/No:24003}  FALLS:  Has patient fallen in last 6 months? {yes/no:20286}, Number of falls: ***  LIVING ENVIRONMENT: Lives with: {[facility] lives with:25569::"lives with their family"} Lives in: {Lives in:25570} Stairs: {yes/no:20286}; {Stairs:24000} Has following equipment at home: {Assistive devices:23999}  OCCUPATION: ***  PLOF: {PLOF:24004}  PATIENT GOALS ***   OBJECTIVE:   FINDINGS:  R Ankle 06/18/21 Status post ORIF of the distal fibular and medial malleolar fracture with plate and screw fixation of the distal fibula. There are 2 cancellous screw in the medial malleolus. Soft tissue swelling about the ankle as expected.   IMPRESSION: Status post ORIF for distal fibular and medial malleolar fracture.  FINDINGS: R femur 06/18/21 Displaced oblique distal femoral shaft fracture with 1 shaft width posterior displacement of the distal fracture fragment. Mild apex medial angulation.   Associated soft tissue swelling/deformity possible prepatellar laceration.   IMPRESSION: Displaced distal femoral shaft fracture, as above.   PATIENT SURVEYS:  {rehab surveys:24030}  COGNITION:  Overall cognitive status: {cognition:24006}     SENSATION:  Light touch: {intact/deficits:24005}  Stereognosis: {intact/deficits:24005}  Hot/Cold: {intact/deficits:24005}  Proprioception: {intact/deficits:24005}  MUSCLE LENGTH: Hamstrings: Right *** deg; Left *** deg Thomas test: Right *** deg; Left *** deg  POSTURE:  ***  PALPATION: ***  LE AROM/PROM:  A/PROM Right 07/01/2021 Left 07/01/2021  Hip flexion    Hip extension    Hip abduction    Hip adduction    Hip  internal rotation    Hip external rotation    Knee flexion    Knee extension    Ankle dorsiflexion    Ankle plantarflexion    Ankle inversion    Ankle eversion     (Blank rows = not tested)  LE MMT:  MMT Right 07/01/2021 Left 07/01/2021  Hip flexion    Hip extension    Hip abduction    Hip adduction    Hip internal rotation    Hip external rotation    Knee flexion    Knee extension    Ankle dorsiflexion    Ankle plantarflexion    Ankle inversion    Ankle eversion     (Blank rows = not tested)  LOWER EXTREMITY SPECIAL TESTS:  {LEspecialtests:26242}  FUNCTIONAL TESTS:  {Functional tests:24029}  GAIT: Distance walked: *** Assistive device utilized: {Assistive devices:23999} Level of assistance: {Levels of assistance:24026} Comments: ***    TODAY'S TREATMENT: ***   PATIENT EDUCATION:  Education details: *** Person educated: {Person educated:25204} Education method: {Education Method:25205} Education comprehension: {Education Comprehension:25206}   HOME EXERCISE PROGRAM: ***  ASSESSMENT:  CLINICAL IMPRESSION: Patient is a *** y.o. *** who was seen today for physical therapy evaluation and treatment for ***.    OBJECTIVE IMPAIRMENTS {opptimpairments:25111}.   ACTIVITY LIMITATIONS {activity limitations:25113}.   PERSONAL FACTORS {Personal factors:25162} are also affecting patient's functional outcome.    REHAB POTENTIAL: {rehabpotential:25112}  CLINICAL DECISION MAKING: {clinical decision making:25114}  EVALUATION COMPLEXITY: {Evaluation complexity:25115}   GOALS: Goals reviewed with patient? {yes/no:20286}  SHORT TERM GOALS:  STG Name Target Date Goal status  1 *** Baseline:  {follow up:25551} {GOALSTATUS:25110}  2 *** Baseline:  {follow up:25551} {GOALSTATUS:25110}  3 *** Baseline: {follow up:25551} {GOALSTATUS:25110}  4 *** Baseline: {follow up:25551} {GOALSTATUS:25110}  5 *** Baseline: {follow up:25551} {GOALSTATUS:25110}  6  *** Baseline: {follow up:25551} {GOALSTATUS:25110}  7 *** Baseline: {follow up:25551} {GOALSTATUS:25110}   LONG TERM GOALS:   LTG Name Target Date Goal status  1 *** Baseline: {follow up:25551} {GOALSTATUS:25110}  2 *** Baseline: {follow up:25551} {GOALSTATUS:25110}  3 *** Baseline: {follow up:25551} {GOALSTATUS:25110}  4 *** Baseline: {follow up:25551} {GOALSTATUS:25110}  5 *** Baseline: {follow up:25551} {GOALSTATUS:25110}  6 *** Baseline: {follow up:25551} {GOALSTATUS:25110}  7 *** Baseline: {follow up:25551} {GOALSTATUS:25110}   PLAN: PT FREQUENCY: {rehab frequency:25116}  PT DURATION: {rehab duration:25117}  PLANNED INTERVENTIONS: {rehab planned interventions:25118::"Therapeutic exercises","Therapeutic activity","Neuro Muscular re-education","Balance training","Gait training","Patient/Family education","Joint mobilization"}  PLAN FOR NEXT SESSION: ***   Kella Splinter, PT 07/01/2021, 10:13 PM

## 2021-07-02 ENCOUNTER — Ambulatory Visit: Payer: No Typology Code available for payment source

## 2021-07-07 NOTE — Therapy (Incomplete)
?OUTPATIENT PHYSICAL THERAPY LOWER EXTREMITY EVALUATION ? ? ?Patient Name: Danielle Goodman ?MRN: [id number] ?DOB:06-13-1995, 26 y.o., female ?Today's Date: 07/07/2021 ? ? ? ?Past Medical History:  ?Diagnosis Date  ? ADHD (attention deficit hyperactivity disorder)   ? Asthma   ? Headache   ? Migrainse with pregnancty  ? SVD (spontaneous vaginal delivery) 03/17/2016  ? ?Past Surgical History:  ?Procedure Laterality Date  ? ADENOIDECTOMY    ? LAPAROSCOPIC APPENDECTOMY N/A 02/12/2021  ? Procedure: APPENDECTOMY LAPAROSCOPIC;  Surgeon: Henrene Dodge, MD;  Location: [facility] ORS;  Service: General;  Laterality: N/A;  ? ORIF ANKLE FRACTURE Right 06/18/2021  ? Procedure: OPEN REDUCTION INTERNAL FIXATION (ORIF) ANKLE FRACTURE;  Surgeon: Roby Lofts, MD;  Location: [facility];  Service: Orthopedics;  Laterality: Right;  ? ORIF FEMUR FRACTURE Left 06/18/2021  ? Procedure: OPEN REDUCTION INTERNAL FIXATION (ORIF) DISTAL FEMUR FRACTURE;  Surgeon: Roby Lofts, MD;  Location: [facility];  Service: Orthopedics;  Laterality: Left;  ? TONSILLECTOMY    ? ?Patient Active Problem List  ? Diagnosis Date Noted  ? Ankle fracture, bimalleolar, closed, right, initial encounter 06/21/2021  ? Open supracondylar fracture of femur, left, type I or II, initial encounter (HCC) 06/21/2021  ? Other fracture of left femur, initial encounter for closed fracture (HCC) 06/17/2021  ? Alcohol intoxication (HCC) 06/07/2021  ? Acute appendicitis 02/12/2021  ? Normal labor and delivery 03/17/2016  ? SVD (spontaneous vaginal delivery) 03/17/2016  ? Premature rupture of membranes 07/10/2013  ? ? ?PCP: [facility] ? ?REFERRING PROVIDER: West Bali, PA-C ? ?REFERRING DIAG: S72.452B (ICD-10-CM) - Open supracondylar fracture of femur, left, type I or II, initial encounter (HCC) S82.841A (ICD-10-CM) - Ankle fracture, bimalleolar, closed, right, initial encounter  ? ?THERAPY DIAG:  ?No diagnosis found. ? ?ONSET DATE: *** ? ?SUBJECTIVE:  ? ?SUBJECTIVE  STATEMENT: ?*** ? ?PERTINENT HISTORY: ?CPT 27513-Open reduction internal fixation of left open distal femur ?CPT 27507-Open reduction internal fixation of left femur shaft ?CPT 27814-Open reduction internal fixation of right bimalleolar ankle fracture ?CPT 27385-Repair of left quadriceps tear ?CPT 11012-Irrigation and debridement of left open distal femur fracture ? ?PAIN:  ?Are you having pain? {yes/no:20286} ?NPRS scale: ***/10 ?Pain location: *** ?Pain orientation: {Pain Orientation:25161}  ?PAIN TYPE: {type:313116} ?Pain description: {PAIN DESCRIPTION:21022940}  ?Aggravating factors: *** ?Relieving factors: *** ? ?Are you having pain? {yes/no:20286} ?NPRS scale: ***/10 ?Pain location: *** ?Pain orientation: {Pain Orientation:25161}  ?PAIN TYPE: {type:313116} ?Pain description: {PAIN DESCRIPTION:21022940}  ?Aggravating factors: *** ?Relieving factors: *** ? ? ?PRECAUTIONS: {Therapy precautions:24002} ? ?WEIGHT BEARING RESTRICTIONS {Yes ***/No:24003} ? ?FALLS:  ?Has patient fallen in last 6 months? {yes/no:20286}, Number of falls: *** ? ?LIVING ENVIRONMENT: ?Lives with: {[facility] lives with:25569::"lives with their family"} ?Lives in: {Lives in:25570} ?Stairs: {yes/no:20286}; {Stairs:24000} ?Has following equipment at home: {Assistive devices:23999} ? ?OCCUPATION: *** ? ?PLOF: {PLOF:24004} ? ?PATIENT GOALS *** ? ? ?OBJECTIVE:  ? ?DIAGNOSTIC FINDINGS: *** ? ?PATIENT SURVEYS:  ?{rehab surveys:24030} ? ?COGNITION: ? Overall cognitive status: {cognition:24006}   ?  ?SENSATION: ? Light touch: {intact/deficits:24005} ? Stereognosis: {intact/deficits:24005} ? Hot/Cold: {intact/deficits:24005} ? Proprioception: {intact/deficits:24005} ? ?MUSCLE LENGTH: ?Hamstrings: Right *** deg; Left *** deg ?Thomas test: Right *** deg; Left *** deg ? ?POSTURE:  ?*** ? ?PALPATION: ?*** ? ?LE AROM/PROM: ? ?A/PROM Right ?07/07/2021 Left ?07/07/2021  ?Hip flexion    ?Hip extension    ?Hip abduction    ?Hip adduction    ?Hip internal rotation     ?Hip external rotation    ?Knee flexion    ?  Knee extension    ?Ankle dorsiflexion    ?Ankle plantarflexion    ?Ankle inversion    ?Ankle eversion    ? (Blank rows = not tested) ? ?LE MMT: ? ?MMT Right ?07/07/2021 Left ?07/07/2021  ?Hip flexion    ?Hip extension    ?Hip abduction    ?Hip adduction    ?Hip internal rotation    ?Hip external rotation    ?Knee flexion    ?Knee extension    ?Ankle dorsiflexion    ?Ankle plantarflexion    ?Ankle inversion    ?Ankle eversion    ? (Blank rows = not tested) ? ?LOWER EXTREMITY SPECIAL TESTS:  ?{LEspecialtests:26242} ? ?FUNCTIONAL TESTS:  ?{Functional tests:24029} ? ?GAIT: ?Distance walked: *** ?Assistive device utilized: {Assistive devices:23999} ?Level of assistance: {Levels of assistance:24026} ?Comments: *** ? ? ? ?TODAY'S TREATMENT: ?*** ? ? ?PATIENT EDUCATION:  ?Education details: *** ?Person educated: {Person educated:25204} ?Education method: {Education Method:25205} ?Education comprehension: {Education Comprehension:25206} ? ? ?HOME EXERCISE PROGRAM: ?*** ? ?ASSESSMENT: ? ?CLINICAL IMPRESSION: ?Patient is a *** y.o. *** who was seen today for physical therapy evaluation and treatment for ***.  ? ? ?OBJECTIVE IMPAIRMENTS {opptimpairments:25111}.  ? ?ACTIVITY LIMITATIONS {activity limitations:25113}.  ? ?PERSONAL FACTORS {Personal factors:25162} are also affecting patient's functional outcome.  ? ? ?REHAB POTENTIAL: {rehabpotential:25112} ? ?CLINICAL DECISION MAKING: {clinical decision making:25114} ? ?EVALUATION COMPLEXITY: {Evaluation complexity:25115} ? ? ?GOALS: ?Goals reviewed with patient? {yes/no:20286} ? ?SHORT TERM GOALS: ? ?STG Name Target Date Goal status  ?1 *** ?Baseline:  {follow up:25551} {GOALSTATUS:25110}  ?2 *** ?Baseline:  {follow up:25551} {GOALSTATUS:25110}  ?3 *** ?Baseline: {follow up:25551} {GOALSTATUS:25110}  ?4 *** ?Baseline: {follow up:25551} {GOALSTATUS:25110}  ?5 *** ?Baseline: {follow up:25551} {GOALSTATUS:25110}  ?6 *** ?Baseline: {follow  up:25551} {GOALSTATUS:25110}  ?7 *** ?Baseline: {follow up:25551} {GOALSTATUS:25110}  ? ?LONG TERM GOALS:  ? ?LTG Name Target Date Goal status  ?1 *** ?Baseline: {follow up:25551} {GOALSTATUS:25110}  ?2 *** ?Baseline: {follow up:25551} {GOALSTATUS:25110}  ?3 *** ?Baseline: {follow up:25551} {GOALSTATUS:25110}  ?4 *** ?Baseline: {follow up:25551} {GOALSTATUS:25110}  ?5 *** ?Baseline: {follow up:25551} {GOALSTATUS:25110}  ?6 *** ?Baseline: {follow up:25551} {GOALSTATUS:25110}  ?7 *** ?Baseline: {follow up:25551} {GOALSTATUS:25110}  ? ?PLAN: ?PT FREQUENCY: {rehab frequency:25116} ? ?PT DURATION: {rehab duration:25117} ? ?PLANNED INTERVENTIONS: Therapeutic exercises, Therapeutic activity, Neuromuscular re-education, Balance training, Gait training, Patient/Family education, Joint mobilization, DME instructions, Cryotherapy, Moist heat, Manual lymph drainage, Taping, Vasopneumatic device, and Manual therapy ? ?PLAN FOR NEXT SESSION: *** ? ? ?Kijana Cromie, PT ?07/07/2021, 2:17 PM  ?

## 2021-07-08 ENCOUNTER — Ambulatory Visit: Payer: No Typology Code available for payment source | Admitting: Physical Therapy
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
## 2021-07-12 ENCOUNTER — Ambulatory Visit: Payer: No Typology Code available for payment source

## 2021-07-13 ENCOUNTER — Ambulatory Visit: Payer: Medicaid Other | Attending: Student

## 2021-07-13 ENCOUNTER — Other Ambulatory Visit: Payer: Self-pay

## 2021-07-13 DIAGNOSIS — S72452B Displaced supracondylar fracture without intracondylar extension of lower end of left femur, initial encounter for open fracture type I or II: Secondary | ICD-10-CM | POA: Diagnosis not present

## 2021-07-13 DIAGNOSIS — R2681 Unsteadiness on feet: Secondary | ICD-10-CM | POA: Insufficient documentation

## 2021-07-13 DIAGNOSIS — R2689 Other abnormalities of gait and mobility: Secondary | ICD-10-CM | POA: Diagnosis present

## 2021-07-13 DIAGNOSIS — M25571 Pain in right ankle and joints of right foot: Secondary | ICD-10-CM | POA: Diagnosis present

## 2021-07-13 DIAGNOSIS — M6281 Muscle weakness (generalized): Secondary | ICD-10-CM | POA: Insufficient documentation

## 2021-07-13 DIAGNOSIS — M25562 Pain in left knee: Secondary | ICD-10-CM | POA: Insufficient documentation

## 2021-07-13 DIAGNOSIS — S82841A Displaced bimalleolar fracture of right lower leg, initial encounter for closed fracture: Secondary | ICD-10-CM | POA: Insufficient documentation

## 2021-07-13 DIAGNOSIS — R262 Difficulty in walking, not elsewhere classified: Secondary | ICD-10-CM
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
## 2021-07-19 ENCOUNTER — Ambulatory Visit: Payer: Medicaid Other

## 2021-07-21 ENCOUNTER — Ambulatory Visit: Payer: Medicaid Other

## 2021-07-21 ENCOUNTER — Telehealth: Payer: Self-pay
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
## 2021-07-21 NOTE — Telephone Encounter (Signed)
TC regarding missed visit, patient thought her appointment was tomorrow 3/16, reminded her of next appointment on 3/20 ?

## 2021-07-26 ENCOUNTER — Telehealth: Payer: Self-pay

## 2021-07-26 NOTE — Telephone Encounter (Signed)
PT called and spoke with patient regarding missed appointment. Pt has difficulty with transportation and needs to decrease treatment frequency. ? ?PT explained attendance policy and how she can now only schedule one visit at a time due to no shows. Patient noted that this would probably be better for her schedule anyway and confirmed next appointment. ? ?Danielle Goodman, PT ?07/26/21 3:27 PM ? ? ? ?
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
## 2021-07-28 ENCOUNTER — Ambulatory Visit: Payer: Medicaid Other

## 2021-08-04 ENCOUNTER — Ambulatory Visit: Payer: Medicaid Other

## 2021-08-11 ENCOUNTER — Ambulatory Visit: Payer: No Typology Code available for payment source

## 2021-08-14 ENCOUNTER — Other Ambulatory Visit (HOSPITAL_COMMUNITY): Payer: Self-pay | Admitting: Physician Assistant

## 2021-08-14 ENCOUNTER — Other Ambulatory Visit: Payer: Self-pay | Admitting: Physician Assistant

## 2021-08-14 DIAGNOSIS — S82841A Displaced bimalleolar fracture of right lower leg, initial encounter for closed fracture: Secondary | ICD-10-CM

## 2021-08-14 DIAGNOSIS — S72452B Displaced supracondylar fracture without intracondylar extension of lower end of left femur, initial encounter for open fracture type I or II: Secondary | ICD-10-CM

## 2021-08-14 MED ORDER — OXYCODONE-ACETAMINOPHEN 5-325 MG PO TABS: 1.0000 | ORAL_TABLET | Freq: Four times a day (QID) | ORAL | 0 refills | Status: DC | PRN

## 2021-08-14 NOTE — Progress Notes (Unsigned)
Patient called the after hour service requesting a refill of her pain medication. She has been unable to receive it from her normal pharmacy since they have been out of stock. I spoke to Dr. Haddix's team yesterday and they were in agreement with the refill. However, patient was unable to pick it up from [facility] as they were out of stock.  ? ?Patient had a ORIF distal femur with Dr. Haddix on 06/18/21. Will send temporary refill to patient's requested pharmacy [facility] [address] [place]. Discussed patient should call their office on Monday to discuss further options for pain management. ? ?Robbi Scurlock, PA-C ?08/14/21 ?

## 2021-08-14 NOTE — Progress Notes (Unsigned)
Patient called the after hour service requesting a refill of her pain medication. She has been unable to receive it from her normal pharmacy since they have been out of stock. I spoke to Dr. Luvenia Starch team yesterday and they were in agreement with the refill. However, patient was unable to pick it up from [facility] as they were out of stock.  ? ?Patient had a ORIF distal femur with Dr. Jena Gauss on 06/18/21. Will send temporary refill to patient's requested pharmacy [facility] [address] [place]. Discussed patient should call their office on Monday to discuss further options for pain management. ? ?Alfonse Alpers, PA-C ?08/14/21 ?

## 2021-08-18 ENCOUNTER — Ambulatory Visit: Payer: No Typology Code available for payment source

## 2021-11-23 ENCOUNTER — Emergency Department
Admission: EM | Admit: 2021-11-23 | Discharge: 2021-11-23 | Disposition: A | Discharge status: Home / Self Care | Payer: Medicaid Other | Attending: Emergency Medicine | Admitting: Emergency Medicine

## 2021-11-23 ENCOUNTER — Emergency Department: Payer: Medicaid Other

## 2021-11-23 ENCOUNTER — Encounter: Payer: Self-pay | Admitting: Emergency Medicine

## 2021-11-23 DIAGNOSIS — M79605 Pain in left leg: Secondary | ICD-10-CM | POA: Insufficient documentation

## 2021-11-23 LAB — C-REACTIVE PROTEIN: CRP: 0.8 mg/dL (ref <1.0)

## 2021-11-23 LAB — POC URINE PREG, ED
Preg Test, Ur: NEGATIVE
Preg Test, Ur: NEGATIVE

## 2021-11-23 LAB — BASIC METABOLIC PANEL
Anion gap: 7 (ref 5–15)
BUN: 16 mg/dL (ref 6–20)
CO2: 26 mmol/L (ref 22–32)
Calcium: 9.3 mg/dL (ref 8.9–10.3)
Chloride: 103 mmol/L (ref 98–111)
Creatinine, Ser: 0.6 mg/dL (ref 0.44–1.00)
GFR, Estimated: >60 mL/min (ref >60)
Glucose, Bld: 82 mg/dL (ref 70–99)
Potassium: 4.2 mmol/L (ref 3.5–5.1)
Sodium: 136 mmol/L (ref 135–145)

## 2021-11-23 LAB — CBC WITH DIFFERENTIAL/PLATELET
Abs Immature Granulocytes: 0.03 10*3/uL (ref 0.00–0.07)
Basophils Absolute: 0.1 10*3/uL (ref 0.0–0.1)
Basophils Relative: 1 %
Eosinophils Absolute: 0.2 10*3/uL (ref 0.0–0.5)
Eosinophils Relative: 2 %
HCT: 35.2 % — ABNORMAL LOW (ref 36.0–46.0)
Hemoglobin: 11.2 g/dL — ABNORMAL LOW (ref 12.0–15.0)
Immature Granulocytes: 0 %
Lymphocytes Relative: 25 %
Lymphs Abs: 2.5 10*3/uL (ref 0.7–4.0)
MCH: 28.8 pg (ref 26.0–34.0)
MCHC: 31.8 g/dL (ref 30.0–36.0)
MCV: 90.5 fL (ref 80.0–100.0)
Monocytes Absolute: 0.9 10*3/uL (ref 0.1–1.0)
Monocytes Relative: 9 %
Neutro Abs: 6.4 10*3/uL (ref 1.7–7.7)
Neutrophils Relative %: 63 %
Platelets: 435 10*3/uL — ABNORMAL HIGH (ref 150–400)
RBC: 3.89 MIL/uL (ref 3.87–5.11)
RDW: 14 % (ref 11.5–15.5)
WBC: 10.1 10*3/uL (ref 4.0–10.5)
nRBC: 0 % (ref 0.0–0.2)

## 2021-11-23 LAB — SEDIMENTATION RATE: Sed Rate: 28 mm/hr — ABNORMAL HIGH (ref 0–20)

## 2021-11-23 MED ORDER — ACETAMINOPHEN 325 MG PO TABS
650.0000 mg | ORAL_TABLET | Freq: Once | ORAL | Status: AC
  Administered 2021-11-23: 650 mg via ORAL
  Filled 2021-11-23: qty 2

## 2021-11-23 NOTE — Discharge Instructions (Signed)
Please follow up this week with your orthopedist. Return if you develop redness, swelling, fever, chills, worsening pain or any other concerns.
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
## 2021-11-23 NOTE — ED Triage Notes (Signed)
Pt presents via POV with complaints of left knee pain that started last night. Hx of metal rod placement in her knee without complications. Pt notes working more than normal with increase in working. Denies falls or injury.

## 2021-12-09 ENCOUNTER — Other Ambulatory Visit: Payer: Self-pay

## 2021-12-09 ENCOUNTER — Emergency Department (HOSPITAL_COMMUNITY): Payer: Self-pay

## 2021-12-09 ENCOUNTER — Encounter (HOSPITAL_COMMUNITY): Payer: Self-pay

## 2021-12-09 ENCOUNTER — Emergency Department (HOSPITAL_COMMUNITY)
Admission: EM | Admit: 2021-12-09 | Discharge: 2021-12-10 | Payer: Self-pay | Attending: Emergency Medicine | Admitting: Emergency Medicine

## 2021-12-09 DIAGNOSIS — Z008 Encounter for other general examination: Secondary | ICD-10-CM

## 2021-12-09 DIAGNOSIS — S0592XA Unspecified injury of left eye and orbit, initial encounter: Secondary | ICD-10-CM | POA: Insufficient documentation

## 2021-12-09 DIAGNOSIS — Z0279 Encounter for issue of other medical certificate: Secondary | ICD-10-CM | POA: Insufficient documentation

## 2021-12-09 LAB — PREGNANCY, URINE: Preg Test, Ur: NEGATIVE

## 2021-12-09 MED ORDER — ACETAMINOPHEN 500 MG PO TABS
1000.0000 mg | ORAL_TABLET | Freq: Once | ORAL | Status: AC
  Administered 2021-12-09: 1000 mg via ORAL
  Filled 2021-12-09: qty 2
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
## 2021-12-10 ENCOUNTER — Emergency Department (HOSPITAL_COMMUNITY): Payer: Medicaid Other

## 2021-12-10 NOTE — Discharge Instructions (Addendum)
You were evaluated in the Emergency Department and after careful evaluation, we did not find any emergent condition requiring admission or further testing in the hospital.  Your exam/testing today is overall reassuring.  CT scan did not show any broken bones or emergencies.  Recommend Tylenol or Motrin for discomfort.  Please return to the Emergency Department if you experience any worsening of your condition.   Thank you for allowing [place] to be a part of your care.
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
## 2022-01-21 ENCOUNTER — Ambulatory Visit (HOSPITAL_COMMUNITY): Payer: Self-pay

## 2022-11-08 ENCOUNTER — Encounter: Payer: Self-pay | Admitting: Emergency Medicine

## 2022-11-08 ENCOUNTER — Other Ambulatory Visit: Payer: Self-pay

## 2022-11-08 ENCOUNTER — Emergency Department: Payer: Self-pay

## 2022-11-08 ENCOUNTER — Emergency Department
Admission: EM | Admit: 2022-11-08 | Discharge: 2022-11-08 | Disposition: A | Discharge status: Home / Self Care | Payer: Self-pay | Attending: Emergency Medicine | Admitting: Emergency Medicine

## 2022-11-08 DIAGNOSIS — S0083XA Contusion of other part of head, initial encounter: Secondary | ICD-10-CM | POA: Insufficient documentation

## 2022-11-08 DIAGNOSIS — F0781 Postconcussional syndrome: Secondary | ICD-10-CM | POA: Insufficient documentation

## 2022-11-08 DIAGNOSIS — W01198A Fall on same level from slipping, tripping and stumbling with subsequent striking against other object, initial encounter: Secondary | ICD-10-CM | POA: Insufficient documentation

## 2022-11-08 DIAGNOSIS — S0990XA Unspecified injury of head, initial encounter: Secondary | ICD-10-CM

## 2022-11-08 MED ORDER — ONDANSETRON 4 MG PO TBDP
4.0000 mg | ORAL_TABLET | Freq: Once | ORAL | Status: AC
  Administered 2022-11-08: 4 mg via ORAL
  Filled 2022-11-08: qty 1

## 2022-11-08 NOTE — Discharge Instructions (Signed)
Your exam and CT scan are normal and reassuring following your minor head injury. There is no evidence of a serious brain injury. You do have symptoms consistent with a mild post-concussive syndrome. Take OTC Tylenol along with the prescription IBU and nausea medicine. Follow-up with your PCP as needed.

## 2022-11-08 NOTE — ED Triage Notes (Signed)
Pt here with a head injury from a fall on Sat. Pt states she hit her head on the concrete and has been dizzy and vomiting since then. Pt c/o a headache. Pt denies any other complaints.

## 2022-11-08 NOTE — ED Provider Notes (Signed)
[facility] Emergency Department Provider Note     Event Date/Time   First MD Initiated Contact with Patient 11/08/22 1542     (approximate)   History   Head Injury   HPI  Danielle Goodman is a 27 y.o. female with a noncontributory medical history presents to the ED for evaluation following a mechanical fall. She reports hitter her head following a fall on Saturday. Since that time, she has endorsed intermittent dizziness, headache, and nausea w/ vomiting. She denies any other injury related to her fall.   Physical Exam   Triage Vital Signs: ED Triage Vitals [11/08/22 1521]  Enc Vitals Group     BP (!) 159/88     Pulse Rate 100     Resp 18     Temp 98.3 F (36.8 C)     Temp Source Oral     SpO2 100 %     Weight 162 lb 0.6 oz (73.5 kg)     Height 5\' 1"  (1.549 m)     Head Circumference      Peak Flow      Pain Score 6     Pain Loc      Pain Edu?      Excl. in GC?     Most recent vital signs: Vitals:   11/08/22 1521  BP: (!) 159/88  Pulse: 100  Resp: 18  Temp: 98.3 F (36.8 C)  SpO2: 100%    General Awake, no distress. NAD HEENT NCAT. PERRL. EOMI. No rhinorrhea. Mucous membranes are moist.  CV:  Good peripheral perfusion. RRR RESP:  Normal effort. CTA ABD:  No distention.  NEURO: CN II-XII grossly intact.    ED Results / Procedures / Treatments   Labs (all labs ordered are listed, but only abnormal results are displayed) Labs Reviewed - No data to display   EKG   RADIOLOGY  I personally viewed and evaluated these images as part of my medical decision making, as well as reviewing the written report by the radiologist.  ED Provider Interpretation: NAD  CT HEAD WO CONTRAST ( )  Result Date: 11/08/2022 CLINICAL DATA:  Facial trauma, dizzy and vomiting since recent injury EXAM: CT HEAD WITHOUT CONTRAST TECHNIQUE: Contiguous axial images were obtained from the base of the skull through the vertex without intravenous  contrast. RADIATION DOSE REDUCTION: This exam was performed according to the departmental dose-optimization program which includes automated exposure control, adjustment of the mA and/or kV according to patient size and/or use of iterative reconstruction technique. COMPARISON:  06/17/2021 CT head, 12/10/2021 CT maxillofacial FINDINGS: Brain: No evidence of acute infarction, hemorrhage, mass, mass effect, or midline shift. No hydrocephalus or extra-axial fluid collection. Vascular: No hyperdense vessel. Skull: Negative for fracture or focal lesion. Sinuses/Orbits: No acute finding. Other: The mastoid air cells are well aerated. IMPRESSION: No acute intracranial process. Electronically Signed   By: Wiliam Ke M.D.   On: 11/08/2022 16:14     PROCEDURES:  Critical Care performed: No  Procedures   MEDICATIONS ORDERED IN ED: Medications  ondansetron (ZOFRAN-ODT) disintegrating tablet 4 mg (4 mg Oral Given 11/08/22 1633)     IMPRESSION / MDM / ASSESSMENT AND PLAN / ED COURSE  I reviewed the triage vital signs and the nursing notes.                              Differential diagnosis includes, but is not limited  to, intracranial hemorrhage, meningitis/encephalitis, previous head trauma, cavernous venous thrombosis, tension headache, temporal arteritis, migraine or migraine equivalent, idiopathic intracranial hypertension, and non-specific headache.   Patient's presentation is most consistent with acute complicated illness / injury requiring diagnostic workup.  Patient's diagnosis is consistent with post-concussive syndrome following a mechanical fall and minor posterior head contusion. Her exam is reassuring without evidence of acute neuro deficit. Patient will be discharged home with prescriptions for Zofran for headache management. She is given concussion protocals. Patient is to follow up with her PCP as needed or otherwise directed. Patient is given ED precautions to return to the ED for any  worsening or new symptoms.   FINAL CLINICAL IMPRESSION(S) / ED DIAGNOSES   Final diagnoses:  Minor head injury, initial encounter  Post concussion syndrome     Rx / DC Orders   ED Discharge Orders     None        Note:  This document was prepared using Dragon voice recognition software and may include unintentional dictation errors.    Lissa Hoard, PA-C 11/08/22 1740    Jene Every, MD 11/08/22 [phone]

## 2023-01-30 ENCOUNTER — Emergency Department: Payer: Self-pay

## 2023-01-30 ENCOUNTER — Emergency Department
Admission: EM | Admit: 2023-01-30 | Discharge: 2023-01-30 | Disposition: A | Discharge status: Home / Self Care | Payer: Self-pay | Attending: Emergency Medicine | Admitting: Emergency Medicine

## 2023-01-30 ENCOUNTER — Other Ambulatory Visit: Payer: Self-pay

## 2023-01-30 DIAGNOSIS — R1030 Lower abdominal pain, unspecified: Secondary | ICD-10-CM

## 2023-01-30 DIAGNOSIS — N83201 Unspecified ovarian cyst, right side: Secondary | ICD-10-CM | POA: Insufficient documentation

## 2023-01-30 DIAGNOSIS — N83209 Unspecified ovarian cyst, unspecified side: Secondary | ICD-10-CM

## 2023-01-30 LAB — COMPREHENSIVE METABOLIC PANEL
ALT: 21 U/L (ref 0–44)
AST: 22 U/L (ref 15–41)
Albumin: 4 g/dL (ref 3.5–5.0)
Alkaline Phosphatase: 78 U/L (ref 38–126)
Anion gap: 11 (ref 5–15)
BUN: 13 mg/dL (ref 6–20)
CO2: 23 mmol/L (ref 22–32)
Calcium: 8.8 mg/dL — ABNORMAL LOW (ref 8.9–10.3)
Chloride: 101 mmol/L (ref 98–111)
Creatinine, Ser: 0.57 mg/dL (ref 0.44–1.00)
GFR, Estimated: >60 mL/min (ref >60)
Glucose, Bld: 95 mg/dL (ref 70–99)
Potassium: 3.8 mmol/L (ref 3.5–5.1)
Sodium: 135 mmol/L (ref 135–145)
Total Bilirubin: 1 mg/dL (ref 0.3–1.2)
Total Protein: 7.5 g/dL (ref 6.5–8.1)

## 2023-01-30 LAB — URINALYSIS, ROUTINE W REFLEX MICROSCOPIC
Bacteria, UA: NONE SEEN
Bilirubin Urine: NEGATIVE
Glucose, UA: NEGATIVE mg/dL
Ketones, ur: NEGATIVE mg/dL
Nitrite: NEGATIVE
Protein, ur: 30 mg/dL — AB
Specific Gravity, Urine: 1.025 (ref 1.005–1.030)
pH: 7 (ref 5.0–8.0)

## 2023-01-30 LAB — CBC
HCT: 34 % — ABNORMAL LOW (ref 36.0–46.0)
Hemoglobin: 11.1 g/dL — ABNORMAL LOW (ref 12.0–15.0)
MCH: 30 pg (ref 26.0–34.0)
MCHC: 32.6 g/dL (ref 30.0–36.0)
MCV: 91.9 fL (ref 80.0–100.0)
Platelets: 369 10*3/uL (ref 150–400)
RBC: 3.7 MIL/uL — ABNORMAL LOW (ref 3.87–5.11)
RDW: 12.5 % (ref 11.5–15.5)
WBC: 6.3 10*3/uL (ref 4.0–10.5)
nRBC: 0 % (ref 0.0–0.2)

## 2023-01-30 LAB — POC URINE PREG, ED: Preg Test, Ur: NEGATIVE

## 2023-01-30 LAB — LIPASE, BLOOD: Lipase: 23 U/L (ref 11–51)

## 2023-01-30 MED ORDER — IOHEXOL 300 MG/ML  SOLN
100.0000 mL | Freq: Once | INTRAMUSCULAR | Status: AC | PRN
  Administered 2023-01-30: 100 mL via INTRAVENOUS

## 2023-01-30 MED ORDER — HYDROCODONE-ACETAMINOPHEN 5-325 MG PO TABS: 1.0000 | ORAL_TABLET | Freq: Four times a day (QID) | ORAL | 0 refills | Status: AC | PRN

## 2023-01-30 MED ORDER — DICYCLOMINE HCL 10 MG PO CAPS
20.0000 mg | ORAL_CAPSULE | Freq: Once | ORAL | Status: AC
  Administered 2023-01-30: 20 mg via ORAL
  Filled 2023-01-30: qty 2

## 2023-01-30 MED ORDER — KETOROLAC TROMETHAMINE 15 MG/ML IJ SOLN
15.0000 mg | Freq: Once | INTRAMUSCULAR | Status: AC
  Administered 2023-01-30: 15 mg via INTRAVENOUS
  Filled 2023-01-30: qty 1

## 2023-01-30 MED ORDER — SODIUM CHLORIDE 0.9 % IV BOLUS
1000.0000 mL | Freq: Once | INTRAVENOUS | Status: AC
  Administered 2023-01-30: 1000 mL via INTRAVENOUS

## 2023-01-30 NOTE — ED Provider Notes (Signed)
[facility] Provider Note    Event Date/Time   First MD Initiated Contact with Patient 01/30/23 1001     (approximate)   History   Abdominal Pain   HPI  Danielle Goodman is a 27 year old female presenting to the Emergency Department for evaluation of abdominal pain for the past 3 days.  Patient reports her pain is throughout the abdomen, occasionally radiating to her back.  No associated nausea, vomiting, diarrhea.  Having regular bowel movements.  Does report some dysuria.  History of appendectomy.  No fevers or chills.  Recently started menstrual cycle.    Physical Exam   Triage Vital Signs: ED Triage Vitals [01/30/23 0952]  Encounter Vitals Group     BP 129/75     Systolic BP Percentile      Diastolic BP Percentile      Pulse Rate 86     Resp 18     Temp 98.5 F (36.9 C)     Temp src      SpO2 100 %     Weight 178 lb (80.7 kg)     Height 5\' 1"  (1.549 m)     Head Circumference      Peak Flow      Pain Score 5     Pain Loc      Pain Education      Exclude from Growth Chart     Most recent vital signs: Vitals:   01/30/23 0952  BP: 129/75  Pulse: 86  Resp: 18  Temp: 98.5 F (36.9 C)  SpO2: 100%     General: Awake, interactive  CV:  Regular rate, good peripheral perfusion.  Resp:  Lungs clear, unlabored respirations.  Abd:  Soft, nondistended, tenderness palpation diffusely without rebound or guarding Neuro:  Symmetric facial movement, fluid speech   ED Results / Procedures / Treatments   Labs (all labs ordered are listed, but only abnormal results are displayed) Labs Reviewed  COMPREHENSIVE METABOLIC PANEL - Abnormal; Notable for the following components:      Result Value   Calcium 8.8 (*)    All other components within normal limits  CBC - Abnormal; Notable for the following components:   RBC 3.70 (*)    Hemoglobin 11.1 (*)    HCT 34.0 (*)    All other components within normal limits  URINALYSIS, ROUTINE W REFLEX  MICROSCOPIC - Abnormal; Notable for the following components:   Color, Urine YELLOW (*)    APPearance CLOUDY (*)    Hgb urine dipstick MODERATE (*)    Protein, ur 30 (*)    Leukocytes,Ua SMALL (*)    All other components within normal limits  LIPASE, BLOOD  POC URINE PREG, ED     EKG EKG independently reviewed interpreted by myself (ER attending) demonstrates:    RADIOLOGY Imaging independently reviewed and interpreted by myself demonstrates:  CT demonstrates fluid in the pelvic sidewall concerning for ruptured hemorrhagic cyst  PROCEDURES:  Critical Care performed: No  Procedures   MEDICATIONS ORDERED IN ED: Medications  dicyclomine (BENTYL) capsule 20 mg (20 mg Oral Given 01/30/23 1051)  sodium chloride 0.9 % bolus 1,000 mL (0 mLs Intravenous Stopped 01/30/23 1443)  iohexol (OMNIPAQUE) 300 MG/ML solution 100 mL (100 mLs Intravenous Contrast Given 01/30/23 1157)  ketorolac (TORADOL) 15 MG/ML injection 15 mg (15 mg Intravenous Given 01/30/23 1441)     IMPRESSION / MDM / ASSESSMENT AND PLAN / ED COURSE  I reviewed the triage vital signs  and the nursing notes.  Differential diagnosis includes, but is not limited to, UTI, colitis, biliary pathology, gastritis, peptic ulcer disease, cramping associated menstrual cycle  Patient's presentation is most consistent with acute presentation with potential threat to life or bodily function.  27 year old presenting to the Emergency Department for evaluation of abdominal pain.  Vital signs stable on presentation.  Labs with stable anemia, reassuring CMP.  UA likely with contaminant blood in the setting of menstrual cycle, otherwise overall not suggestive of infection.  Normal lipase.  UPT negative.  CT concerning for possible hemorrhagic cyst.  Discussed pain management for this versus ultrasound to further evaluate and patient is concerned about alternative pathology such as torsion, so ultrasound was ordered.  If this is reassuring, we  will plan to discharge with pain control.  Strict return precautions provided.      FINAL CLINICAL IMPRESSION(S) / ED DIAGNOSES   Final diagnoses:  Hemorrhagic ovarian cyst  Lower abdominal pain     Rx / DC Orders   ED Discharge Orders          Ordered    HYDROcodone-acetaminophen (NORCO) 5-325 MG tablet  Every 6 hours PRN        01/30/23 1622             Note:  This document was prepared using Dragon voice recognition software and may include unintentional dictation errors.   Danielle Post, MD 01/30/23 [phone]

## 2023-01-30 NOTE — Discharge Instructions (Addendum)
You were seen in the emergency room today for evaluation of your abdominal pain.  I suspect your pain is related to rupture of an ovarian cyst.  Please follow-up with OB/GYN for further evaluation of your pain.  You can take Tylenol Profen to help with your pain.  If you have breakthrough pain, I sent a short course of narcotic pain medicine to your pharmacy that you can take as needed.  Return to the ER for new or worsening symptoms.

## 2023-01-30 NOTE — ED Triage Notes (Signed)
Pt to ED for mid abd pain for past 3 days. Also reports some back pain and urinary sx. Denies n/v/d.

## 2023-03-16 ENCOUNTER — Emergency Department
Admission: EM | Admit: 2023-03-16 | Discharge: 2023-03-16 | Disposition: A | Discharge status: Home / Self Care | Payer: Self-pay | Attending: Emergency Medicine | Admitting: Emergency Medicine

## 2023-03-16 ENCOUNTER — Other Ambulatory Visit: Payer: Self-pay

## 2023-03-16 ENCOUNTER — Encounter: Payer: Self-pay | Admitting: Medical Oncology

## 2023-03-16 DIAGNOSIS — O039 Complete or unspecified spontaneous abortion without complication: Secondary | ICD-10-CM | POA: Insufficient documentation

## 2023-03-16 DIAGNOSIS — N939 Abnormal uterine and vaginal bleeding, unspecified: Secondary | ICD-10-CM

## 2023-03-16 LAB — CBC
HCT: 36 % (ref 36.0–46.0)
Hemoglobin: 11.7 g/dL — ABNORMAL LOW (ref 12.0–15.0)
MCH: 29.4 pg (ref 26.0–34.0)
MCHC: 32.5 g/dL (ref 30.0–36.0)
MCV: 90.5 fL (ref 80.0–100.0)
Platelets: 422 10*3/uL — ABNORMAL HIGH (ref 150–400)
RBC: 3.98 MIL/uL (ref 3.87–5.11)
RDW: 12.8 % (ref 11.5–15.5)
WBC: 9.5 10*3/uL (ref 4.0–10.5)
nRBC: 0 % (ref 0.0–0.2)

## 2023-03-16 LAB — POC URINE PREG, ED: Preg Test, Ur: NEGATIVE

## 2023-03-16 LAB — BASIC METABOLIC PANEL
Anion gap: 8 (ref 5–15)
BUN: 8 mg/dL (ref 6–20)
CO2: 23 mmol/L (ref 22–32)
Calcium: 8.7 mg/dL — ABNORMAL LOW (ref 8.9–10.3)
Chloride: 102 mmol/L (ref 98–111)
Creatinine, Ser: 0.48 mg/dL (ref 0.44–1.00)
GFR, Estimated: >60 mL/min (ref >60)
Glucose, Bld: 104 mg/dL — ABNORMAL HIGH (ref 70–99)
Potassium: 4.2 mmol/L (ref 3.5–5.1)
Sodium: 133 mmol/L — ABNORMAL LOW (ref 135–145)

## 2023-03-16 NOTE — ED Provider Notes (Signed)
[facility] Provider Note   Event Date/Time   First MD Initiated Contact with Patient 03/16/23 [phone]     (approximate) History  Vaginal Bleeding and Cough  HPI Danielle Goodman is a 27 y.o. female with a past medical history of of G2, P2 who presents complaining of vaginal bleeding in the setting of positive urine pregnancy test at home 3 weeks prior to arrival.  Patient states that she had heavy bleeding over the weekend of about 3 days and has subsided at this point. ROS: Patient currently denies any vision changes, tinnitus, difficulty speaking, facial droop, sore throat, chest pain, shortness of breath, abdominal pain, nausea/vomiting/diarrhea, dysuria, or weakness/numbness/paresthesias in any extremity   Physical Exam  Triage Vital Signs: ED Triage Vitals  Encounter Vitals Group     BP 03/16/23 0740 (!) 142/89     Systolic BP Percentile --      Diastolic BP Percentile --      Pulse Rate 03/16/23 0740 85     Resp 03/16/23 0740 18     Temp 03/16/23 0740 98.5 F (36.9 C)     Temp Source 03/16/23 0740 Oral     SpO2 03/16/23 0740 99 %     Weight 03/16/23 0741 178 lb (80.7 kg)     Height 03/16/23 0741 5\' 1"  (1.549 m)     Head Circumference --      Peak Flow --      Pain Score 03/16/23 0740 0     Pain Loc --      Pain Education --      Exclude from Growth Chart --    Most recent vital signs: Vitals:   03/16/23 0740  BP: (!) 142/89  Pulse: 85  Resp: 18  Temp: 98.5 F (36.9 C)  SpO2: 99%   General: Awake, oriented x4. CV:  Good peripheral perfusion.  Resp:  Normal effort.  Abd:  No distention.  Other:  Young adult obese Caucasian female resting comfortably in no acute distress ED Results / Procedures / Treatments  Labs (all labs ordered are listed, but only abnormal results are displayed) Labs Reviewed  CBC  BASIC METABOLIC PANEL  POC URINE PREG, ED   PROCEDURES: Critical Care performed: No Procedures MEDICATIONS ORDERED IN  ED: Medications - No data to display IMPRESSION / MDM / ASSESSMENT AND PLAN / ED COURSE  I reviewed the triage vital signs and the nursing notes.                             The patient is on the cardiac monitor to evaluate for evidence of arrhythmia and/or significant heart rate changes. Patient's presentation is most consistent with acute presentation with potential threat to life or bodily function. Workup: CBC, BMP, UA, bHCG  Based on History, Exam, and ED Workup patient's presentation not consistent with ectopic pregnancy, molar pregnancy, life-threatening coagulopathy, trauma, serious bacterial infection, central process or other emergency.  Patient has a negative urine pregnancy test and therefore did not require OB ultrasound.  Patient counseled regarding this being a possible miscarriage and what to expect and follow-up.  Patient provided emotional support and all questions were answered prior to discharge  Disposition: Will discharge home with return precautions   FINAL CLINICAL IMPRESSION(S) / ED DIAGNOSES   Final diagnoses:  Complete miscarriage  Abnormal vaginal bleeding   Rx / DC Orders   ED Discharge Orders     None  Note:  This document was prepared using Dragon voice recognition software and may include unintentional dictation errors.   Merwyn Katos, MD 03/16/23 [phone]

## 2023-03-16 NOTE — ED Notes (Signed)
See triage note  Presents with some vaginal bleeding  States she had a positive pregnancy test at home  but then developed some heavy bleeding

## 2023-03-16 NOTE — ED Triage Notes (Signed)
Pt reports she took a preg test a few weeks ago and it was positive, over the weekend she had 3 days of light vaginal bleeding with some cramping. It has all subsided now. Just wants to be checked out.  Pt also reports cough with wheezing.

## 2023-06-29 ENCOUNTER — Other Ambulatory Visit: Payer: Self-pay

## 2023-06-29 ENCOUNTER — Emergency Department
Admission: EM | Admit: 2023-06-29 | Discharge: 2023-06-29 | Disposition: A | Discharge status: Home / Self Care | Payer: MEDICAID | Attending: Emergency Medicine | Admitting: Emergency Medicine

## 2023-06-29 DIAGNOSIS — R197 Diarrhea, unspecified: Secondary | ICD-10-CM | POA: Diagnosis not present

## 2023-06-29 DIAGNOSIS — J45909 Unspecified asthma, uncomplicated: Secondary | ICD-10-CM | POA: Diagnosis not present

## 2023-06-29 DIAGNOSIS — J111 Influenza due to unidentified influenza virus with other respiratory manifestations: Secondary | ICD-10-CM | POA: Diagnosis not present

## 2023-06-29 DIAGNOSIS — Z20828 Contact with and (suspected) exposure to other viral communicable diseases: Secondary | ICD-10-CM | POA: Diagnosis not present

## 2023-06-29 DIAGNOSIS — R509 Fever, unspecified: Secondary | ICD-10-CM | POA: Diagnosis present

## 2023-06-29 DIAGNOSIS — R112 Nausea with vomiting, unspecified: Secondary | ICD-10-CM | POA: Insufficient documentation

## 2023-06-29 LAB — COMPREHENSIVE METABOLIC PANEL
ALT: 31 U/L (ref 0–44)
AST: 35 U/L (ref 15–41)
Albumin: 3.7 g/dL (ref 3.5–5.0)
Alkaline Phosphatase: 79 U/L (ref 38–126)
Anion gap: 9 (ref 5–15)
BUN: 11 mg/dL (ref 6–20)
CO2: 22 mmol/L (ref 22–32)
Calcium: 8.8 mg/dL — ABNORMAL LOW (ref 8.9–10.3)
Chloride: 105 mmol/L (ref 98–111)
Creatinine, Ser: 0.57 mg/dL (ref 0.44–1.00)
GFR, Estimated: >60 mL/min (ref >60)
Glucose, Bld: 117 mg/dL — ABNORMAL HIGH (ref 70–99)
Potassium: 4.2 mmol/L (ref 3.5–5.1)
Sodium: 136 mmol/L (ref 135–145)
Total Bilirubin: 0.4 mg/dL (ref 0.0–1.2)
Total Protein: 7.5 g/dL (ref 6.5–8.1)

## 2023-06-29 LAB — URINALYSIS, ROUTINE W REFLEX MICROSCOPIC
Bacteria, UA: NONE SEEN
Bilirubin Urine: NEGATIVE
Glucose, UA: NEGATIVE mg/dL
Ketones, ur: NEGATIVE mg/dL
Leukocytes,Ua: NEGATIVE
Nitrite: NEGATIVE
Protein, ur: NEGATIVE mg/dL
Specific Gravity, Urine: 1.01 (ref 1.005–1.030)
pH: 5 (ref 5.0–8.0)

## 2023-06-29 LAB — SALICYLATE LEVEL: Salicylate Lvl: <7 mg/dL — ABNORMAL LOW (ref 7.0–30.0)

## 2023-06-29 LAB — CBC
HCT: 35.5 % — ABNORMAL LOW (ref 36.0–46.0)
Hemoglobin: 11.9 g/dL — ABNORMAL LOW (ref 12.0–15.0)
MCH: 30.3 pg (ref 26.0–34.0)
MCHC: 33.5 g/dL (ref 30.0–36.0)
MCV: 90.3 fL (ref 80.0–100.0)
Platelets: 356 10*3/uL (ref 150–400)
RBC: 3.93 MIL/uL (ref 3.87–5.11)
RDW: 11.9 % (ref 11.5–15.5)
WBC: 13.7 10*3/uL — ABNORMAL HIGH (ref 4.0–10.5)
nRBC: 0 % (ref 0.0–0.2)

## 2023-06-29 LAB — RESP PANEL BY RT-PCR (RSV, FLU A&B, COVID)  RVPGX2
Influenza A by PCR: NEGATIVE
Influenza B by PCR: NEGATIVE
Resp Syncytial Virus by PCR: NEGATIVE
SARS Coronavirus 2 by RT PCR: NEGATIVE

## 2023-06-29 LAB — ACETAMINOPHEN LEVEL: Acetaminophen (Tylenol), Serum: <10 ug/mL — ABNORMAL LOW (ref 10–30)

## 2023-06-29 LAB — LIPASE, BLOOD: Lipase: 23 U/L (ref 11–51)

## 2023-06-29 MED ORDER — ONDANSETRON 4 MG PO TBDP: 4.0000 mg | ORAL_TABLET | Freq: Three times a day (TID) | ORAL | 0 refills | Status: DC | PRN

## 2023-06-29 MED ORDER — ONDANSETRON 4 MG PO TBDP
4.0000 mg | ORAL_TABLET | Freq: Once | ORAL | Status: AC
  Administered 2023-06-29: 4 mg via ORAL
  Filled 2023-06-29: qty 1

## 2023-06-29 MED ORDER — ONDANSETRON 4 MG PO TBDP: 4.0000 mg | ORAL_TABLET | Freq: Once | ORAL | Status: DC | PRN

## 2023-06-29 NOTE — ED Triage Notes (Addendum)
Patient reports feeling unwell with body aches. States she started taking childrens tylenol and childrens motrin 2 days ago. Patient states yesterday she started having nausea and diarrhea. States she think she took to much tylenol and motrin. States she thinks she took an excessive amount, but dose not know the amount or how many times she took the medication. Reports diarrhea grater than 5 times yesterday

## 2023-06-29 NOTE — Discharge Instructions (Addendum)
Your exam and labs are normal at this time. Your symptoms are likely due to influenza. Take the nausea medicine as needed.

## 2023-06-30 NOTE — ED Provider Notes (Signed)
 [facility] Emergency Department Provider Note     Event Date/Time   First MD Initiated Contact with Patient 06/29/23 1323     (approximate)   History   Diarrhea   HPI  Danielle Goodman is a 28 y.o. female with a history of asthma and ADHD, presents to the ED reporting malaise, body aches, and subjective fevers.  Patient had been taken OTC children's Tylenol and Motrin for the last 2 days.  She reports that her family members have been positive for flu in the last few days.  She began to develop some nausea and diarrhea and was concerned that she had overdosed on the 2 OTC medications.  She presents to the ED for evaluation of her symptoms.  Patient denies any fevers, chest pain, abdominal pain, shortness of breath, or syncope.  Physical Exam   Triage Vital Signs: ED Triage Vitals  Encounter Vitals Group     BP 06/29/23 1132 (!) 154/84     Systolic BP Percentile --      Diastolic BP Percentile --      Pulse Rate 06/29/23 1132 93     Resp 06/29/23 1132 15     Temp 06/29/23 1132 98.4 F (36.9 C)     Temp Source 06/29/23 1132 Oral     SpO2 06/29/23 1132 99 %     Weight 06/29/23 1133 180 lb (81.6 kg)     Height 06/29/23 1133 5\' 1"  (1.549 m)     Head Circumference --      Peak Flow --      Pain Score 06/29/23 1133 7     Pain Loc --      Pain Education --      Exclude from Growth Chart --     Most recent vital signs: Vitals:   06/29/23 1132  BP: (!) 154/84  Pulse: 93  Resp: 15  Temp: 98.4 F (36.9 C)  SpO2: 99%    General Awake, no distress. NAD HEENT NCAT. PERRL. EOMI. No rhinorrhea. Mucous membranes are moist.  CV:  Good peripheral perfusion. RRR RESP:  Normal effort. CTA ABD:  No distention.  Soft and nontender.  Normoactive bowel sounds noted x 4.  ED Results / Procedures / Treatments   Labs (all labs ordered are listed, but only abnormal results are displayed) Labs Reviewed  COMPREHENSIVE METABOLIC PANEL - Abnormal; Notable  for the following components:      Result Value   Glucose, Bld 117 (*)    Calcium 8.8 (*)    All other components within normal limits  CBC - Abnormal; Notable for the following components:   WBC 13.7 (*)    Hemoglobin 11.9 (*)    HCT 35.5 (*)    All other components within normal limits  URINALYSIS, ROUTINE W REFLEX MICROSCOPIC - Abnormal; Notable for the following components:   Color, Urine YELLOW (*)    APPearance HAZY (*)    Hgb urine dipstick MODERATE (*)    All other components within normal limits  SALICYLATE LEVEL - Abnormal; Notable for the following components:   Salicylate Lvl <7.0 (*)    All other components within normal limits  ACETAMINOPHEN LEVEL - Abnormal; Notable for the following components:   Acetaminophen (Tylenol), Serum <10 (*)    All other components within normal limits  RESP PANEL BY RT-PCR (RSV, FLU A&B, COVID)  RVPGX2  LIPASE, BLOOD     EKG   RADIOLOGY No results found.  PROCEDURES:  Critical Care performed: No  Procedures   MEDICATIONS ORDERED IN ED: Medications  ondansetron (ZOFRAN-ODT) disintegrating tablet 4 mg (4 mg Oral Given 06/29/23 1430)     IMPRESSION / MDM / ASSESSMENT AND PLAN / ED COURSE  I reviewed the triage vital signs and the nursing notes.                              Differential diagnosis includes, but is not limited to, COVID, flu, RSV, viral gastroenteritis, dehydration, drug reaction, toxicity  Patient's presentation is most consistent with acute complicated illness / injury requiring diagnostic workup.  Patient's diagnosis is consistent with likely nausea vomiting diarrhea due to recent flu exposure versus acute viral gastroenteritis.  Patient presents in no acute distress with no evidence of dehydration, toxic appearance, or sepsis.  Labs are reassuring and show no acute electrolyte abnormalities.  No toxic levels of acetaminophen or salicylate are noted.  Patient is stable at this time without ongoing  tractable nausea, vomiting, or diarrhea.  Patient is reassured by her exam workup at this time.  We will submit a viral panel test, but discharge the patient to follow-up with ongoing management of symptoms as appropriate.  Patient will be discharged home with prescriptions for Zofran. Patient is to follow up with her primary provider as discussed, as needed or otherwise directed. Patient is given ED precautions to return to the ED for any worsening or new symptoms.     FINAL CLINICAL IMPRESSION(S) / ED DIAGNOSES   Final diagnoses:  Nausea vomiting and diarrhea  Exposure to the flu     Rx / DC Orders   ED Discharge Orders          Ordered    ondansetron (ZOFRAN-ODT) 4 MG disintegrating tablet  Every 8 hours PRN        06/29/23 1404             Note:  This document was prepared using Dragon voice recognition software and may include unintentional dictation errors.    Lissa Hoard, PA-C 06/30/23 2143    Delton [place], MD 07/03/23 [phone]

## 2023-09-18 DIAGNOSIS — Z30017 Encounter for initial prescription of implantable subdermal contraceptive: Secondary | ICD-10-CM | POA: Diagnosis not present

## 2023-09-18 DIAGNOSIS — R87610 Atypical squamous cells of undetermined significance on cytologic smear of cervix (ASC-US): Secondary | ICD-10-CM | POA: Diagnosis not present

## 2023-09-18 DIAGNOSIS — Z01419 Encounter for gynecological examination (general) (routine) without abnormal findings: Secondary | ICD-10-CM | POA: Diagnosis not present

## 2023-09-18 DIAGNOSIS — Z124 Encounter for screening for malignant neoplasm of cervix: Secondary | ICD-10-CM | POA: Diagnosis not present

## 2023-09-18 DIAGNOSIS — Z113 Encounter for screening for infections with a predominantly sexual mode of transmission: Secondary | ICD-10-CM | POA: Diagnosis not present

## 2023-09-18 DIAGNOSIS — Z3046 Encounter for surveillance of implantable subdermal contraceptive: Secondary | ICD-10-CM | POA: Diagnosis not present

## 2023-10-02 ENCOUNTER — Emergency Department

## 2023-10-02 ENCOUNTER — Encounter: Payer: Self-pay | Admitting: Emergency Medicine

## 2023-10-02 ENCOUNTER — Emergency Department
Admission: EM | Admit: 2023-10-02 | Discharge: 2023-10-02 | Disposition: A | Discharge status: Home / Self Care | Attending: Emergency Medicine | Admitting: Emergency Medicine

## 2023-10-02 ENCOUNTER — Other Ambulatory Visit: Payer: Self-pay

## 2023-10-02 DIAGNOSIS — R052 Subacute cough: Secondary | ICD-10-CM | POA: Diagnosis not present

## 2023-10-02 DIAGNOSIS — R059 Cough, unspecified: Secondary | ICD-10-CM | POA: Diagnosis not present

## 2023-10-02 DIAGNOSIS — R051 Acute cough: Secondary | ICD-10-CM | POA: Insufficient documentation

## 2023-10-02 DIAGNOSIS — R0602 Shortness of breath: Secondary | ICD-10-CM | POA: Diagnosis not present

## 2023-10-02 LAB — RESP PANEL BY RT-PCR (RSV, FLU A&B, COVID)  RVPGX2
Influenza A by PCR: NEGATIVE
Influenza B by PCR: NEGATIVE
Resp Syncytial Virus by PCR: NEGATIVE
SARS Coronavirus 2 by RT PCR: NEGATIVE

## 2023-10-02 MED ORDER — IPRATROPIUM-ALBUTEROL 0.5-2.5 (3) MG/3ML IN SOLN
3.0000 mL | Freq: Once | RESPIRATORY_TRACT | Status: AC
  Administered 2023-10-02: 3 mL via RESPIRATORY_TRACT
  Filled 2023-10-02: qty 3

## 2023-10-02 NOTE — Discharge Instructions (Addendum)
 Your COVID, flu, RSV test is negative.  Your chest x-ray is normal.  Please return for any new, worsening, or change in symptoms or other concerns.  It was a pleasure caring for you today.

## 2023-10-02 NOTE — ED Triage Notes (Signed)
 Pt via POV from home. Pt c/o productive cough, SOB, and feeling hot for the past 3-4 days. Denies sick contact. Denies hx of chronic lung issues. Denies pain. Pt is A&Ox4 and NAD, ambulatory to triage with steady gait.

## 2023-10-02 NOTE — ED Notes (Signed)
 Patient declined discharge vital signs.

## 2023-10-02 NOTE — ED Notes (Signed)
 Patient taken to x-ray. Will continue neb treatment upon return per patient's request.

## 2023-10-02 NOTE — ED Provider Notes (Signed)
 [facility] Provider Note    Event Date/Time   First MD Initiated Contact with Patient 10/02/23 [phone]     (approximate)   History   Shortness of Breath and Cough   HPI  Danielle Goodman is a 28 y.o. female who presents today for evaluation of cough for the past 3 to 4 days.  She also feels like she is wheezing.  She reports that she has been vaping for the past 7 years.  No chest pain or hemoptysis.  She reports that her kids are sick with the same symptoms, and were diagnosed with fifths disease.  Patient Active Problem List   Diagnosis Date Noted   Ankle fracture, bimalleolar, closed, right, initial encounter 06/21/2021   Open supracondylar fracture of femur, left, type I or II, initial encounter (HCC) 06/21/2021   Other fracture of left femur, initial encounter for closed fracture (HCC) 06/17/2021   Alcohol intoxication (HCC) 06/07/2021   Acute appendicitis 02/12/2021   Normal labor and delivery 03/17/2016   SVD (spontaneous vaginal delivery) 03/17/2016   Premature rupture of membranes 07/10/2013          Physical Exam   Triage Vital Signs: ED Triage Vitals  Encounter Vitals Group     BP 10/02/23 0926 136/82     Systolic BP Percentile --      Diastolic BP Percentile --      Pulse Rate 10/02/23 0926 94     Resp 10/02/23 0926 18     Temp 10/02/23 0926 98.2 F (36.8 C)     Temp Source 10/02/23 0926 Oral     SpO2 10/02/23 0926 100 %     Weight 10/02/23 0925 174 lb (78.9 kg)     Height 10/02/23 0925 5\' 1"  (1.549 m)     Head Circumference --      Peak Flow --      Pain Score 10/02/23 0924 0     Pain Loc --      Pain Education --      Exclude from Growth Chart --     Most recent vital signs: Vitals:   10/02/23 0926  BP: 136/82  Pulse: 94  Resp: 18  Temp: 98.2 F (36.8 C)  SpO2: 100%    Physical Exam Vitals and nursing note reviewed.  Constitutional:      General: Awake and alert. No acute distress.    Appearance: Normal  appearance.   HENT:     Head: Normocephalic and atraumatic.     Mouth: Mucous membranes are moist.  Eyes:     General: PERRL. Normal EOMs        Right eye: No discharge.        Left eye: No discharge.     Conjunctiva/sclera: Conjunctivae normal.  Cardiovascular:     Rate and Rhythm: Normal rate and regular rhythm.     Pulses: Normal pulses.  Pulmonary:     Effort: Pulmonary effort is normal. No respiratory distress.  Able to speak easily in complete sentences    Breath sounds: Normal breath sounds.  Abdominal:     Abdomen is soft. There is no abdominal tenderness. No rebound or guarding. No distention. Musculoskeletal:        General: No swelling. Normal range of motion.     Cervical back: Normal range of motion and neck supple.  Skin:    General: Skin is warm and dry.     Capillary Refill: Capillary refill takes less than 2  seconds.     Findings: No rash.  Neurological:     Mental Status: The patient is awake and alert.      ED Results / Procedures / Treatments   Labs (all labs ordered are listed, but only abnormal results are displayed) Labs Reviewed  RESP PANEL BY RT-PCR (RSV, FLU A&B, COVID)  [id number]     EKG     RADIOLOGY I independently reviewed and interpreted imaging and agree with radiologists findings.     PROCEDURES:  Critical Care performed:   Procedures   MEDICATIONS ORDERED IN ED: Medications  ipratropium-albuterol  (DUONEB) 0.5-2.5 (3) MG/3ML nebulizer solution 3 mL (3 mLs Nebulization Given 10/02/23 1016)     IMPRESSION / MDM / ASSESSMENT AND PLAN / ED COURSE  I reviewed the triage vital signs and the nursing notes.   Differential diagnosis includes, but is not limited to, bronchitis, pneumonia, URI, reactive airways.  Patient is awake and alert, hemodynamically stable and afebrile.  She has a normal oxygen saturation of 100% on room air and demonstrates no increased work of breathing.  No chest pain, pleurisy, hemoptysis, calf pain, leg  swelling, history of PE or DVT, or exogenous hormone use, recent trips or travel, recent surgery or hospitalization to suggest PE as a source of her symptoms today.  No chest pain or exertional symptoms to suggest ACS.  She has obvious nasal congestion and a cough suspicious for a viral URI.  She requested a breathing treatment noted x-ray which were provided.  DuoNeb resulted in improvement of cough and shortness of breath, chest x-ray is negative for pneumonia.  Patient is reassured by his results.  We discussed return precautions and outpatient follow-up.  Patient understands and agrees with plan.  She was discharged in stable condition.   Patient's presentation is most consistent with acute complicated illness / injury requiring diagnostic workup.  FINAL CLINICAL IMPRESSION(S) / ED DIAGNOSES   Final diagnoses:  Acute cough     Rx / DC Orders   ED Discharge Orders     None        Note:  This document was prepared using Dragon voice recognition software and may include unintentional dictation errors.   Yalda Herd E, PA-C 10/02/23 1445    Viviano Ground, MD 10/02/23 [phone]

## 2023-10-16 ENCOUNTER — Other Ambulatory Visit: Payer: Self-pay

## 2023-10-16 ENCOUNTER — Encounter: Payer: Self-pay | Admitting: Emergency Medicine

## 2023-10-16 ENCOUNTER — Emergency Department
Admission: EM | Admit: 2023-10-16 | Discharge: 2023-10-16 | Disposition: A | Discharge status: Home / Self Care | Attending: Emergency Medicine | Admitting: Emergency Medicine

## 2023-10-16 ENCOUNTER — Emergency Department

## 2023-10-16 DIAGNOSIS — H538 Other visual disturbances: Secondary | ICD-10-CM | POA: Diagnosis not present

## 2023-10-16 DIAGNOSIS — R519 Headache, unspecified: Secondary | ICD-10-CM | POA: Insufficient documentation

## 2023-10-16 LAB — CBC
HCT: 34.3 % — ABNORMAL LOW (ref 36.0–46.0)
Hemoglobin: 11.3 g/dL — ABNORMAL LOW (ref 12.0–15.0)
MCH: 29.3 pg (ref 26.0–34.0)
MCHC: 32.9 g/dL (ref 30.0–36.0)
MCV: 88.9 fL (ref 80.0–100.0)
Platelets: 390 10*3/uL (ref 150–400)
RBC: 3.86 MIL/uL — ABNORMAL LOW (ref 3.87–5.11)
RDW: 12.4 % (ref 11.5–15.5)
WBC: 8.1 10*3/uL (ref 4.0–10.5)
nRBC: 0 % (ref 0.0–0.2)

## 2023-10-16 LAB — BASIC METABOLIC PANEL WITH GFR
Anion gap: 5 (ref 5–15)
BUN: 12 mg/dL (ref 6–20)
CO2: 24 mmol/L (ref 22–32)
Calcium: 8.9 mg/dL (ref 8.9–10.3)
Chloride: 109 mmol/L (ref 98–111)
Creatinine, Ser: 0.6 mg/dL (ref 0.44–1.00)
GFR, Estimated: >60 mL/min (ref >60)
Glucose, Bld: 91 mg/dL (ref 70–99)
Potassium: 3.9 mmol/L (ref 3.5–5.1)
Sodium: 138 mmol/L (ref 135–145)

## 2023-10-16 LAB — URINALYSIS, W/ REFLEX TO CULTURE (INFECTION SUSPECTED)
Bacteria, UA: NONE SEEN
Bilirubin Urine: NEGATIVE
Glucose, UA: NEGATIVE mg/dL
Ketones, ur: NEGATIVE mg/dL
Leukocytes,Ua: NEGATIVE
Nitrite: NEGATIVE
Protein, ur: NEGATIVE mg/dL
Specific Gravity, Urine: 1.031 — ABNORMAL HIGH (ref 1.005–1.030)
pH: 5 (ref 5.0–8.0)

## 2023-10-16 LAB — PREGNANCY, URINE: Preg Test, Ur: NEGATIVE

## 2023-10-16 LAB — CBG MONITORING, ED: Glucose-Capillary: 108 mg/dL — ABNORMAL HIGH (ref 70–99)

## 2023-10-16 MED ORDER — LIDOCAINE HCL (PF) 1 % IJ SOLN: 5.0000 mL | Freq: Once | INTRAMUSCULAR | Status: DC

## 2023-10-16 MED ORDER — KETOROLAC TROMETHAMINE 30 MG/ML IJ SOLN
30.0000 mg | Freq: Once | INTRAMUSCULAR | Status: AC
  Administered 2023-10-16: 30 mg via INTRAMUSCULAR
  Filled 2023-10-16: qty 1

## 2023-10-16 MED ORDER — GADOBUTROL 1 MMOL/ML IV SOLN
7.0000 mL | Freq: Once | INTRAVENOUS | Status: AC | PRN
  Administered 2023-10-16: 7 mL via INTRAVENOUS

## 2023-10-16 NOTE — ED Provider Notes (Signed)
 [facility] Provider Note    Event Date/Time   First MD Initiated Contact with Patient 10/16/23 [phone]     (approximate)   History   Headache   HPI  Danielle Goodman is a 28 y.o. female past medical history significant for migraine headache who presents to the emergency department with a headache.  Endorses 1 week of a headache that is worse in the morning time and she has been taking Excedrin.  States that her headache does improve with Excedrin but then returns.  States that she has been taking Excedrin on a daily basis.  No recent falls or head trauma.  Does endorse some mild blurry vision to her right eye.  Denies any photophobia or pain with extraocular movement.  Denies any numbness or weakness.  No nausea or vomiting.  No unexplained weight loss.  Feels that she just feels slightly off and was concerned.  Denies any burning with urination.  Adamantly denies any concern for pregnancy.  Uses Nexplanon for birth control with no estrogen use.  History of migraine headache but has been in the past.  No thunderclap headache and not the worst headache of her life.     Physical Exam   Triage Vital Signs: ED Triage Vitals  Encounter Vitals Group     BP 10/16/23 0838 123/67     Systolic BP Percentile --      Diastolic BP Percentile --      Pulse Rate 10/16/23 0838 93     Resp 10/16/23 0838 17     Temp 10/16/23 0837 98.1 F (36.7 C)     Temp Source 10/16/23 0837 Oral     SpO2 10/16/23 0838 98 %     Weight 10/16/23 0837 170 lb (77.1 kg)     Height 10/16/23 0837 5\' 1"  (1.549 m)     Head Circumference --      Peak Flow --      Pain Score 10/16/23 0837 3     Pain Loc --      Pain Education --      Exclude from Growth Chart --     Most recent vital signs: Vitals:   10/16/23 0838 10/16/23 1245  BP: 123/67 128/77  Pulse: 93 88  Resp: 17 16  Temp:  98 F (36.7 C)  SpO2: 98% 98%    Physical Exam Constitutional:      Appearance: She is  well-developed.  HENT:     Head: Atraumatic.  Eyes:     Conjunctiva/sclera: Conjunctivae normal.     Comments: Right eye 20/2025, left eye 20/30.  Pupils are equal and reactive.  Normal direct and consensual reactivity.  No photophobia.  Extraocular movements intact.  No nystagmus.  Cardiovascular:     Rate and Rhythm: Regular rhythm.  Pulmonary:     Effort: No respiratory distress.  Abdominal:     General: There is no distension.  Musculoskeletal:        General: Normal range of motion.     Cervical back: Normal range of motion.  Skin:    General: Skin is warm.  Neurological:     Mental Status: She is alert. Mental status is at baseline.     GCS: GCS eye subscore is 4. GCS verbal subscore is 5. GCS motor subscore is 6.     Cranial Nerves: Cranial nerves 2-12 are intact.     Sensory: Sensation is intact.     Motor: Motor function is intact.  Coordination: Coordination is intact.     Gait: Gait is intact.     Deep Tendon Reflexes: Reflexes are normal and symmetric.      IMPRESSION / MDM / ASSESSMENT AND PLAN / ED COURSE  I reviewed the triage vital signs and the nursing notes.  Differential diagnosis including migraine headache, primary headache, malignancy, idiopathic intracranial hypertension.  Have low suspicion for meningitis, no meningismus, fever or altered mental status.  Low suspicion for cerebral venous thrombosis given no risk factors.  Given Toradol  for pain control   RADIOLOGY I independently reviewed imaging, my interpretation of imaging: CT scan of the head -no acute findings.  Read as no acute findings.  MRI brain, MR orbits with and without contrast with no acute findings. Empty sella.    Labs (all labs ordered are listed, but only abnormal results are displayed) Labs interpreted as -    Labs Reviewed  URINALYSIS, W/ REFLEX TO CULTURE (INFECTION SUSPECTED) - Abnormal; Notable for the following components:      Result Value   Color, Urine YELLOW  (*)    APPearance CLOUDY (*)    Specific Gravity, Urine 1.031 (*)    Hgb urine dipstick MODERATE (*)    All other components within normal limits  CBC - Abnormal; Notable for the following components:   RBC 3.86 (*)    Hemoglobin 11.3 (*)    HCT 34.3 (*)    All other components within normal limits  CBG MONITORING, ED - Abnormal; Notable for the following components:   Glucose-Capillary 108 (*)    All other components within normal limits  BASIC METABOLIC PANEL WITH GFR  PREGNANCY, URINE    On reevaluation had resolution of her headache but has ongoing mild blurry vision to the right eye.  Discussed the patient's case with neurology Dr. Alecia Ames in the emergency department who recommended MR brain with and without contrast and MR orbits with and without contrast.  Recommended if normal to perform lumbar puncture to further evaluate for opening pressure.  MR wwo -no acute findings.  Empty sella noted.  Again discussed with the patient need for lumbar puncture to further evaluate for idiopathic intracranial hypertension with her blurred vision.  Ultimately the patient stated that she was feeling better and she wants to defer on a lumbar puncture at this time.  She understands the risk and benefits of diagnosing it and the possibility of chronic eye changes.  No signs of cerebral venous thrombosis noted on MRI.  Given information to follow-up with neurology.  States that she has an appointment scheduled to see her primary care physician on Thursday.  States that she would return to the emergency department if she had any worsening symptoms.     PROCEDURES:  Critical Care performed: No  Procedures  Patient's presentation is most consistent with acute presentation with potential threat to life or bodily function.   MEDICATIONS ORDERED IN ED: Medications  ketorolac  (TORADOL ) 30 MG/ML injection 30 mg (30 mg Intramuscular Given 10/16/23 0911)  gadobutrol (GADAVIST) 1 MMOL/ML injection 7  mL (7 mLs Intravenous Contrast Given 10/16/23 1347)    FINAL CLINICAL IMPRESSION(S) / ED DIAGNOSES   Final diagnoses:  Blurry vision  Acute nonintractable headache, unspecified headache type     Rx / DC Orders   ED Discharge Orders     None        Note:  This document was prepared using Dragon voice recognition software and may include unintentional dictation errors.   Sophiagrace Benbrook,  Cathleen Coach, MD 10/16/23 1510

## 2023-10-16 NOTE — Discharge Instructions (Addendum)
 You were seen in the emergency department for headache.  You had an MRI done that did not show any abnormalities.  Your lab work was overall normal.  We discussed a lumbar puncture in order to further evaluate for the cause of your headache, specifically to evaluate for idiopathic intracranial hypertension.  You ultimately did not want a lumbar puncture or further workup at this time.  It is importantly return to the emergency department if your headache worsens or if you have worsening vision change.  Go to your primary care appointment on Thursday.  You were given information to follow-up with neurology as an outpatient.  Return at anytime to the emergency department to complete workup.

## 2023-10-16 NOTE — ED Notes (Signed)
 See triage note  Presents with headache for a few days  Denies any trauma or fever  States she is having blurred vision mainly to right eye  No redness or swelling noted

## 2023-10-16 NOTE — ED Triage Notes (Signed)
 Patient to ED via POV for headache "for a few days." PT reports blurred vision in right eye as well. NAD noted.

## 2023-10-19 ENCOUNTER — Encounter: Payer: Self-pay | Admitting: General Practice

## 2023-10-19 ENCOUNTER — Ambulatory Visit: Admitting: General Practice

## 2023-10-19 VITALS — BP 122/76 | HR 72 | Temp 98.2°F | Ht 61.75 in | Wt 172.0 lb

## 2023-10-19 DIAGNOSIS — G43909 Migraine, unspecified, not intractable, without status migrainosus: Secondary | ICD-10-CM | POA: Insufficient documentation

## 2023-10-19 DIAGNOSIS — F419 Anxiety disorder, unspecified: Secondary | ICD-10-CM | POA: Diagnosis not present

## 2023-10-19 DIAGNOSIS — Z7689 Persons encountering health services in other specified circumstances: Secondary | ICD-10-CM | POA: Insufficient documentation

## 2023-10-19 MED ORDER — SERTRALINE HCL 50 MG PO TABS: 50.0000 mg | ORAL_TABLET | Freq: Every day | ORAL | 0 refills | Status: DC

## 2023-10-19 NOTE — Patient Instructions (Signed)
 Start sertraline 25 mg once daily at bedtime for 7 days and then increase to 50 mg thereafter.   F/u in 8 weeks for physical and labs.   It was a pleasure to meet you today! Please don't hesitate to contact me with any questions. Welcome to [facility]!

## 2023-10-19 NOTE — Assessment & Plan Note (Signed)
 Uncontrolled.  Reviewed ER notes, labs and MRI report from 10/16/23.  Neuro exam stable today.   Scheduled to see neurology on 10/30/23. Will continue to monitor.

## 2023-10-19 NOTE — Progress Notes (Signed)
 New Patient Office Visit  Subjective    Patient ID: Danielle Goodman, female    DOB: 1996/02/18  Age: 28 y.o. MRN: [id number]  CC:  Chief Complaint  Patient presents with   Establish Care    Increased anxiety recently.    HPI Danielle Goodman is a 28 y.o. female presents to establish care.   Last pcp- [facility].   Anxiety: chronic and ongoing. As she is getting older, feels like her symptoms are worse. She worries all the time, mind racing thoughts, feels on the edge all the time and overwhelmed all the time. She has been able to cope on her own in the past and now she has not been able to. She has been experiencing daily. Sometimes her symptoms get so bad that she has to go lay down. She takes Nyquil at bedtime to go to sleep. She denies SI/HI.   Hx of migraine: she experiences migraine once daily. 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 She always has blurred vision in right eye. Scheduled to neurology 10/30/23 at [facility].   Outpatient Encounter Medications as of 10/19/2023  Medication Sig   etonogestrel (NEXPLANON) 68 MG IMPL implant 1 each by Subdermal route once.   sertraline (ZOLOFT) 50 MG tablet Take 1 tablet (50 mg total) by mouth daily.   No facility-administered encounter medications on file as of 10/19/2023.    Past Medical History:  Diagnosis Date   ADHD (attention deficit hyperactivity disorder)    Asthma    Headache    Migrainse with pregnancty   SVD (spontaneous vaginal delivery) 03/17/2016    Past Surgical History:  Procedure Laterality Date   ADENOIDECTOMY     LAPAROSCOPIC APPENDECTOMY N/A 02/12/2021   Procedure: APPENDECTOMY LAPAROSCOPIC;  Surgeon: Emmalene Hare, MD;  Location: [facility] ORS;  Service: General;  Laterality: N/A;   ORIF ANKLE FRACTURE Right 06/18/2021   Procedure: OPEN  REDUCTION INTERNAL FIXATION (ORIF) ANKLE FRACTURE;  Surgeon: Laneta Pintos, MD;  Location: [facility] OR;  Service: Orthopedics;  Laterality: Right;   ORIF FEMUR FRACTURE Left 06/18/2021   Procedure: OPEN REDUCTION INTERNAL FIXATION (ORIF) DISTAL FEMUR FRACTURE;  Surgeon: Laneta Pintos, MD;  Location: [facility] OR;  Service: Orthopedics;  Laterality: Left;   TONSILLECTOMY      Family History  Problem Relation Age of Onset   Hypertension Mother    Hyperlipidemia Sister     Social History   Socioeconomic History   Marital status: Married    Spouse name: Not on file   Number of children: Not on file   Years of education: Not on file   Highest education level: Not on file  Occupational History   Not on file  Tobacco Use   Smoking status: Former   Smokeless tobacco: Never  Vaping Use   Vaping status: Every Day  Substance and Sexual Activity   Alcohol use: No   Drug use: No   Sexual activity: Yes    Birth control/protection: None, Implant  Other Topics Concern   Not on file  Social History Narrative   Not on file   Social Drivers of Health   Financial Resource Strain: Not on file  Food Insecurity: Not on file  Transportation Needs: Not on file  Physical Activity: Not on file  Stress: Not on file  Social Connections: Not on file  Intimate Partner Violence: Not on file    Review of Systems  Constitutional:  Negative for chills and fever.  Respiratory:  Negative for shortness of breath.   Cardiovascular:  Negative for chest pain.  Gastrointestinal:  Negative for abdominal pain, constipation, diarrhea, heartburn, nausea and vomiting.  Genitourinary:  Negative for dysuria, frequency and urgency.  Neurological:  Negative for dizziness and headaches.  Endo/Heme/Allergies:  Negative for polydipsia.  Psychiatric/Behavioral:  Negative for depression and suicidal ideas. The patient is nervous/anxious.         Objective    BP 122/76 (BP Location: Right Arm, Patient Position:  Sitting, Cuff Size: Normal)   Pulse 72   Temp 98.2 F (36.8 C) (Oral)   Ht 5' 1.75 (1.568 m)   Wt 172 lb (78 kg)   SpO2 97%   Breastfeeding No   BMI 31.71 kg/m   Physical Exam Vitals and nursing note reviewed.  Constitutional:      Appearance: Normal appearance.   Cardiovascular:     Rate and Rhythm: Normal rate and regular rhythm.     Pulses: Normal pulses.     Heart sounds: Normal heart sounds.  Pulmonary:     Effort: Pulmonary effort is normal.     Breath sounds: Normal breath sounds.   Neurological:     Mental Status: She is alert and oriented to person, place, and time.     Cranial Nerves: Cranial nerves 2-12 are intact.     Sensory: Sensation is intact.     Motor: Motor function is intact.     Coordination: Coordination is intact.     Gait: Gait is intact.   Psychiatric:        Mood and Affect: Mood normal.        Behavior: Behavior normal.        Thought Content: Thought content normal.        Judgment: Judgment normal.         Assessment & Plan:  Anxiety Assessment & Plan: Symptoms suggestive of uncontrolled anxiety.  Discussed at length.  Agreeable to start sertraline 25 mg once daily for seven days and then increase to 50 mg thereafter. Rx sent. F/u in 8 weeks.  Handout provided.   Referral placed for therapy.   Orders: -     Sertraline HCl; Take 1 tablet (50 mg total) by mouth daily.  Dispense: 90 tablet; Refill: 0 -     Ambulatory referral to Psychology  Encounter to establish care Assessment & Plan: EMR reviewed briefly.    Migraine without status migrainosus, not intractable, unspecified migraine type Assessment & Plan: Uncontrolled.  Reviewed ER notes, labs and MRI report from 10/16/23.  Neuro exam stable today.   Scheduled to see neurology on 10/30/23. Will continue to monitor.      Return in about 7 weeks (around 12/07/2023) for anxiety.   Jolanda Nation, NP

## 2023-10-19 NOTE — Assessment & Plan Note (Signed)
 EMR reviewed briefly.

## 2023-10-19 NOTE — Assessment & Plan Note (Addendum)
 Symptoms suggestive of uncontrolled anxiety.  Discussed at length.  Agreeable to start sertraline 25 mg once daily for seven days and then increase to 50 mg thereafter. Rx sent. F/u in 8 weeks.  Handout provided.   Referral placed for therapy.

## 2023-10-24 DIAGNOSIS — N879 Dysplasia of cervix uteri, unspecified: Secondary | ICD-10-CM | POA: Diagnosis not present

## 2023-10-24 DIAGNOSIS — R350 Frequency of micturition: Secondary | ICD-10-CM | POA: Diagnosis not present

## 2023-10-28 DIAGNOSIS — G43909 Migraine, unspecified, not intractable, without status migrainosus: Secondary | ICD-10-CM | POA: Diagnosis not present

## 2023-10-30 DIAGNOSIS — H538 Other visual disturbances: Secondary | ICD-10-CM | POA: Diagnosis not present

## 2023-10-30 DIAGNOSIS — E236 Other disorders of pituitary gland: Secondary | ICD-10-CM | POA: Diagnosis not present

## 2023-10-30 DIAGNOSIS — R519 Headache, unspecified: Secondary | ICD-10-CM | POA: Diagnosis not present

## 2023-11-01 ENCOUNTER — Other Ambulatory Visit: Payer: Self-pay | Admitting: Student

## 2023-11-01 DIAGNOSIS — E236 Other disorders of pituitary gland: Secondary | ICD-10-CM

## 2023-11-01 DIAGNOSIS — R519 Headache, unspecified: Secondary | ICD-10-CM

## 2023-11-01 DIAGNOSIS — H538 Other visual disturbances: Secondary | ICD-10-CM

## 2023-11-01 LAB — HM PAP SMEAR
HM Pap smear: POSITIVE
HPV 16/18/45 genotyping: POSITIVE

## 2023-11-08 NOTE — Progress Notes (Signed)
 Patient for DG Lumbar Puncture on Mon 11/13/23, I called and spoke with the patient on the phone and gave pre-procedure instructions. Pt was made aware to be here at 10:30a and check in at the new entrance. Pt stated understanding.  Called 11/01/23

## 2023-11-13 ENCOUNTER — Ambulatory Visit
Admission: RE | Admit: 2023-11-13 | Discharge: 2023-11-13 | Disposition: A | Discharge status: Home / Self Care | Payer: MEDICAID | Source: Ambulatory Visit | Attending: Student | Admitting: Student

## 2023-11-21 ENCOUNTER — Ambulatory Visit
Admission: RE | Admit: 2023-11-21 | Discharge: 2023-11-21 | Disposition: A | Discharge status: Home / Self Care | Payer: MEDICAID | Source: Ambulatory Visit | Attending: Student | Admitting: Student

## 2023-11-21 VITALS — BP 118/79 | HR 68 | Temp 98.0°F | Resp 15

## 2023-11-21 DIAGNOSIS — E236 Other disorders of pituitary gland: Secondary | ICD-10-CM | POA: Insufficient documentation

## 2023-11-21 DIAGNOSIS — Z0189 Encounter for other specified special examinations: Secondary | ICD-10-CM | POA: Insufficient documentation

## 2023-11-21 DIAGNOSIS — H538 Other visual disturbances: Secondary | ICD-10-CM | POA: Diagnosis not present

## 2023-11-21 DIAGNOSIS — R519 Headache, unspecified: Secondary | ICD-10-CM | POA: Diagnosis not present

## 2023-11-21 LAB — PREGNANCY, URINE: Preg Test, Ur: NEGATIVE

## 2023-11-21 MED ORDER — LIDOCAINE 1 % OPTIME INJ - NO CHARGE
5.0000 mL | Freq: Once | INTRAMUSCULAR | Status: AC
  Administered 2023-11-21: 5 mL via INTRADERMAL
  Filled 2023-11-21: qty 6

## 2023-11-21 NOTE — Procedures (Signed)
 PROCEDURE SUMMARY:  Successful fluoroscopic guided lumbar puncture. No immediate complications.  Pt tolerated well.  Opening pressure: 26 cm water. Closing pressure: 13 cm water. EBL = none  Please see full dictation in imaging section of Epic for procedure details.

## 2023-11-26 ENCOUNTER — Other Ambulatory Visit: Payer: Self-pay

## 2023-11-26 ENCOUNTER — Encounter (HOSPITAL_COMMUNITY): Payer: Self-pay

## 2023-11-26 ENCOUNTER — Emergency Department (HOSPITAL_COMMUNITY)
Admission: EM | Admit: 2023-11-26 | Discharge: 2023-11-27 | Disposition: A | Discharge status: Home / Self Care | Attending: Emergency Medicine | Admitting: Emergency Medicine

## 2023-11-26 DIAGNOSIS — R519 Headache, unspecified: Secondary | ICD-10-CM | POA: Diagnosis not present

## 2023-11-26 MED ORDER — LACTATED RINGERS IV BOLUS
1000.0000 mL | Freq: Once | INTRAVENOUS | Status: AC
  Administered 2023-11-27: 1000 mL via INTRAVENOUS

## 2023-11-26 MED ORDER — PROCHLORPERAZINE EDISYLATE 10 MG/2ML IJ SOLN
10.0000 mg | Freq: Once | INTRAMUSCULAR | Status: DC
  Filled 2023-11-26: qty 2

## 2023-11-26 MED ORDER — DEXAMETHASONE SODIUM PHOSPHATE 10 MG/ML IJ SOLN
10.0000 mg | Freq: Once | INTRAMUSCULAR | Status: DC
  Filled 2023-11-26: qty 1

## 2023-11-26 MED ORDER — KETOROLAC TROMETHAMINE 15 MG/ML IJ SOLN
15.0000 mg | Freq: Once | INTRAMUSCULAR | Status: DC
  Filled 2023-11-26: qty 1

## 2023-11-26 MED ORDER — DIPHENHYDRAMINE HCL 50 MG/ML IJ SOLN
50.0000 mg | Freq: Once | INTRAMUSCULAR | Status: AC
  Administered 2023-11-27: 50 mg via INTRAVENOUS
  Filled 2023-11-26: qty 1

## 2023-11-26 NOTE — ED Provider Notes (Signed)
 [facility] EMERGENCY DEPARTMENT AT [facility] Provider Note   CSN: [id number] Arrival date & time: 11/26/23  2320     History Chief Complaint  Patient presents with  . Headache    HPI Danielle Goodman is a 28 y.o. female presenting for ***.   Patient's recorded medical, surgical, social, medication list and allergies were reviewed in the Snapshot window as part of the initial history.   Review of Systems   Review of Systems  Physical Exam Updated Vital Signs BP (!) 146/91   Pulse 85   Temp 100 F (37.8 C) (Oral)   Resp 18   LMP 11/21/2023 (Approximate)   SpO2 100%  Physical Exam   ED Course/ Medical Decision Making/ A&P    Procedures Procedures   Medications Ordered in ED Medications - No data to display  Medical Decision Making:   Danielle Goodman is a 28 y.o. female who presented to the ED today with *** detailed above.    {crccomplexity:27900} Complete initial physical exam performed, notably the patient  was ***.    Reviewed and confirmed nursing documentation for past medical history, family history, social history.    Initial Assessment:   With the patient's presentation of ***, most likely diagnosis is ***. Other diagnoses were considered including (but not limited to) ***. These are considered less likely due to history of present illness and physical exam findings.   {crccopa:27899}  Initial Plan:  ***  ***Screening labs including CBC and Metabolic panel to evaluate for infectious or metabolic etiology of disease.  ***Urinalysis with reflex culture ordered to evaluate for UTI or relevant urologic/nephrologic pathology.  ***CXR to evaluate for structural/infectious intrathoracic pathology.  {crccardiactesting:32591::EKG to evaluate for cardiac pathology} Objective evaluation as below reviewed   Initial Study Results:   Laboratory  All laboratory results reviewed without evidence of clinically relevant pathology.    ***Exceptions include: ***   ***EKG EKG was reviewed independently. Rate, rhythm, axis, intervals all examined and without medically relevant abnormality. ST segments without concerns for elevations.    Radiology:  All images reviewed independently. ***Agree with radiology report at this time.   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 Therapeutic lumbar puncture. EXAM: THERAPEUTIC LUMBAR PUNCTURE UNDER FLUOROSCOPIC GUIDANCE COMPARISON:  CT abdomen and pelvis dated 01/30/2023 FLUOROSCOPY: Radiation Exposure Index (as provided by the fluoroscopic device): 9.0 mGy Kerma PROCEDURE: Informed consent was obtained from the patient prior to the procedure, including potential complications of headache, allergy, and pain. With the patient prone, the lower back was prepped with Betadine . 1% Lidocaine  was used for local anesthesia. Lumbar puncture was performed at left L4-L5 level using a 20 gauge needle with return of clear CSF with an opening pressure of 26 cm water. 10 ml of CSF were obtained. The patient tolerated the procedure well and there were no apparent complications. Closing pressure: 13 cm of water. IMPRESSION: Technically successful therapeutic lumbar puncture. Procedure performed by Carlin Griffon, PA-C and supervised by Manford Cummins, MD. Electronically Signed   By: Limin  Xu M.D.   On: 11/21/2023 12:12      Consults: Case discussed with ***.   Reassessment and Plan:   ***    ***  Clinical Impression: No diagnosis found.   Data Unavailable   Final Clinical Impression(s) / ED Diagnoses Final diagnoses:  None    Rx / DC Orders ED Discharge Orders  None

## 2023-11-26 NOTE — ED Triage Notes (Signed)
 Arrives for a headache. Had an LP on Tuesday for headaches. Pt. States that her head has been hurting since yesterday which is now worse than it was when they did the LP.

## 2023-11-27 LAB — CBC WITH DIFFERENTIAL/PLATELET
Abs Immature Granulocytes: 0.02 K/uL (ref 0.00–0.07)
Basophils Absolute: 0 K/uL (ref 0.0–0.1)
Basophils Relative: 0 %
Eosinophils Absolute: 0.3 K/uL (ref 0.0–0.5)
Eosinophils Relative: 4 %
HCT: 34.4 % — ABNORMAL LOW (ref 36.0–46.0)
Hemoglobin: 11.2 g/dL — ABNORMAL LOW (ref 12.0–15.0)
Immature Granulocytes: 0 %
Lymphocytes Relative: 31 %
Lymphs Abs: 2.3 K/uL (ref 0.7–4.0)
MCH: 30 pg (ref 26.0–34.0)
MCHC: 32.6 g/dL (ref 30.0–36.0)
MCV: 92.2 fL (ref 80.0–100.0)
Monocytes Absolute: 0.8 K/uL (ref 0.1–1.0)
Monocytes Relative: 10 %
Neutro Abs: 4.1 K/uL (ref 1.7–7.7)
Neutrophils Relative %: 55 %
Platelets: 300 K/uL (ref 150–400)
RBC: 3.73 MIL/uL — ABNORMAL LOW (ref 3.87–5.11)
RDW: 12.9 % (ref 11.5–15.5)
WBC: 7.4 K/uL (ref 4.0–10.5)
nRBC: 0 % (ref 0.0–0.2)

## 2023-11-27 LAB — BASIC METABOLIC PANEL WITH GFR
Anion gap: 11 (ref 5–15)
BUN: 11 mg/dL (ref 6–20)
CO2: 22 mmol/L (ref 22–32)
Calcium: 9.2 mg/dL (ref 8.9–10.3)
Chloride: 104 mmol/L (ref 98–111)
Creatinine, Ser: 0.62 mg/dL (ref 0.44–1.00)
GFR, Estimated: >60 mL/min (ref >60)
Glucose, Bld: 102 mg/dL — ABNORMAL HIGH (ref 70–99)
Potassium: 3.6 mmol/L (ref 3.5–5.1)
Sodium: 137 mmol/L (ref 135–145)

## 2023-11-27 MED ORDER — DIAZEPAM 5 MG PO TABS
5.0000 mg | ORAL_TABLET | Freq: Once | ORAL | Status: AC
  Administered 2023-11-27: 5 mg via ORAL
  Filled 2023-11-27: qty 1

## 2023-11-27 MED ORDER — BUTALBITAL-APAP-CAFFEINE 50-325-40 MG PO TABS: 1.0000 | ORAL_TABLET | Freq: Four times a day (QID) | ORAL | 0 refills | Status: AC | PRN

## 2023-11-27 NOTE — ED Notes (Signed)
 Pt requested to discontinue IV and fluids.

## 2023-12-01 DIAGNOSIS — R079 Chest pain, unspecified: Secondary | ICD-10-CM | POA: Diagnosis not present

## 2023-12-01 DIAGNOSIS — R002 Palpitations: Secondary | ICD-10-CM | POA: Diagnosis not present

## 2023-12-10 ENCOUNTER — Other Ambulatory Visit: Payer: Self-pay

## 2023-12-10 ENCOUNTER — Emergency Department

## 2023-12-10 ENCOUNTER — Emergency Department
Admission: EM | Admit: 2023-12-10 | Discharge: 2023-12-10 | Disposition: A | Discharge status: Home / Self Care | Attending: Emergency Medicine | Admitting: Emergency Medicine

## 2023-12-10 DIAGNOSIS — R000 Tachycardia, unspecified: Secondary | ICD-10-CM | POA: Diagnosis not present

## 2023-12-10 DIAGNOSIS — R002 Palpitations: Secondary | ICD-10-CM | POA: Diagnosis not present

## 2023-12-10 DIAGNOSIS — R42 Dizziness and giddiness: Secondary | ICD-10-CM | POA: Diagnosis not present

## 2023-12-10 LAB — URINALYSIS, ROUTINE W REFLEX MICROSCOPIC
Bilirubin Urine: NEGATIVE
Glucose, UA: NEGATIVE mg/dL
Ketones, ur: 5 mg/dL — AB
Leukocytes,Ua: NEGATIVE
Nitrite: NEGATIVE
Protein, ur: 30 mg/dL — AB
Specific Gravity, Urine: 1.024 (ref 1.005–1.030)
pH: 6 (ref 5.0–8.0)

## 2023-12-10 LAB — URINE DRUG SCREEN, QUALITATIVE (ARMC ONLY)
Amphetamines, Ur Screen: NOT DETECTED
Barbiturates, Ur Screen: POSITIVE — AB
Benzodiazepine, Ur Scrn: POSITIVE — AB
Cannabinoid 50 Ng, Ur ~~LOC~~: NOT DETECTED
Cocaine Metabolite,Ur ~~LOC~~: NOT DETECTED
MDMA (Ecstasy)Ur Screen: NOT DETECTED
Methadone Scn, Ur: NOT DETECTED
Opiate, Ur Screen: NOT DETECTED
Phencyclidine (PCP) Ur S: NOT DETECTED
Tricyclic, Ur Screen: NOT DETECTED

## 2023-12-10 LAB — HEPATIC FUNCTION PANEL
ALT: 28 U/L (ref 0–44)
AST: 52 U/L — ABNORMAL HIGH (ref 15–41)
Albumin: 4.4 g/dL (ref 3.5–5.0)
Alkaline Phosphatase: 93 U/L (ref 38–126)
Bilirubin, Direct: 0.2 mg/dL (ref 0.0–0.2)
Indirect Bilirubin: 0.3 mg/dL (ref 0.3–0.9)
Total Bilirubin: 0.5 mg/dL (ref 0.0–1.2)
Total Protein: 8.2 g/dL — ABNORMAL HIGH (ref 6.5–8.1)

## 2023-12-10 LAB — CBC
HCT: 39.1 % (ref 36.0–46.0)
Hemoglobin: 13.2 g/dL (ref 12.0–15.0)
MCH: 29.6 pg (ref 26.0–34.0)
MCHC: 33.8 g/dL (ref 30.0–36.0)
MCV: 87.7 fL (ref 80.0–100.0)
Platelets: 460 K/uL — ABNORMAL HIGH (ref 150–400)
RBC: 4.46 MIL/uL (ref 3.87–5.11)
RDW: 12.7 % (ref 11.5–15.5)
WBC: 9.8 K/uL (ref 4.0–10.5)
nRBC: 0 % (ref 0.0–0.2)

## 2023-12-10 LAB — BASIC METABOLIC PANEL WITH GFR
Anion gap: 14 (ref 5–15)
BUN: 9 mg/dL (ref 6–20)
CO2: 22 mmol/L (ref 22–32)
Calcium: 9.2 mg/dL (ref 8.9–10.3)
Chloride: 100 mmol/L (ref 98–111)
Creatinine, Ser: 0.54 mg/dL (ref 0.44–1.00)
GFR, Estimated: >60 mL/min (ref >60)
Glucose, Bld: 85 mg/dL (ref 70–99)
Potassium: 4.3 mmol/L (ref 3.5–5.1)
Sodium: 136 mmol/L (ref 135–145)

## 2023-12-10 LAB — TROPONIN I (HIGH SENSITIVITY)
Troponin I (High Sensitivity): 3 ng/L (ref <18)
Troponin I (High Sensitivity): 4 ng/L (ref <18)

## 2023-12-10 LAB — TSH: TSH: 0.598 u[IU]/mL (ref 0.350–4.500)

## 2023-12-10 LAB — LIPASE, BLOOD: Lipase: 28 U/L (ref 11–51)

## 2023-12-10 LAB — MAGNESIUM: Magnesium: 1.9 mg/dL (ref 1.7–2.4)

## 2023-12-10 LAB — PREGNANCY, URINE: Preg Test, Ur: NEGATIVE

## 2023-12-10 LAB — ACETAMINOPHEN LEVEL: Acetaminophen (Tylenol), Serum: <10 ug/mL — ABNORMAL LOW (ref 10–30)

## 2023-12-10 LAB — D-DIMER, QUANTITATIVE: D-Dimer, Quant: 0.72 ug{FEU}/mL — ABNORMAL HIGH (ref 0.00–0.50)

## 2023-12-10 MED ORDER — DIAZEPAM 2 MG PO TABS
2.0000 mg | ORAL_TABLET | Freq: Once | ORAL | Status: AC
  Administered 2023-12-10: 2 mg via ORAL
  Filled 2023-12-10: qty 1

## 2023-12-10 MED ORDER — IOHEXOL 350 MG/ML SOLN
75.0000 mL | Freq: Once | INTRAVENOUS | Status: AC | PRN
  Administered 2023-12-10: 75 mL via INTRAVENOUS

## 2023-12-10 MED ORDER — SODIUM CHLORIDE 0.9 % IV BOLUS
1000.0000 mL | Freq: Once | INTRAVENOUS | Status: AC
  Administered 2023-12-10: 1000 mL via INTRAVENOUS

## 2023-12-10 NOTE — Discharge Instructions (Addendum)
 Please follow up with your outpatient provider.  You were also referred to cardiology for your fast heart rate.  Please return to the emergency department immediately if you develop any new, worsening, or change in symptoms or other concerns.  It was a pleasure caring for you today.

## 2023-12-10 NOTE — ED Triage Notes (Signed)
 Pt sts that she has been having palpations this AM. Pt sts that she took nyquil last night and since this AM she has been feeling ill. Pt sts that she also takes zoloft .

## 2023-12-10 NOTE — ED Provider Notes (Signed)
 [facility] Provider Note    Event Date/Time   First MD Initiated Contact with Patient 12/10/23 1334     (approximate)   History   Palpitations   HPI  Danielle Goodman is a 28 y.o. female who presents today for evaluation of palpitations, feeling disoriented, describes feeling like her heart is beating out of her chest, reports she feels like she was hallucinating last night but feels like this has improved, and feels woozy headed.  She also reports that she has mild abdominal upset, but has not had any vomiting or diarrhea.  She reports that she took a swig of NyQuil and middle of the night to help her sleep, and also took this with her Zoloft  so she is unsure if she overdosed.  She was feeling fine prior to taking the NyQuil.  She is feeling very anxious.  Patient Active Problem List   Diagnosis Date Noted   Encounter to establish care 10/19/2023   Anxiety 10/19/2023   Migraine without status migrainosus, not intractable 10/19/2023   Ankle fracture, bimalleolar, closed, right, initial encounter 06/21/2021   Open supracondylar fracture of femur, left, type I or II, initial encounter (HCC) 06/21/2021   Other fracture of left femur, initial encounter for closed fracture (HCC) 06/17/2021   Alcohol intoxication (HCC) 06/07/2021   Acute appendicitis 02/12/2021   Normal labor and delivery 03/17/2016   SVD (spontaneous vaginal delivery) 03/17/2016   Premature rupture of membranes 07/10/2013          Physical Exam   Triage Vital Signs: ED Triage Vitals [12/10/23 1312]  Encounter Vitals Group     BP (!) 136/107     Girls Systolic BP Percentile      Girls Diastolic BP Percentile      Boys Systolic BP Percentile      Boys Diastolic BP Percentile      Pulse Rate (!) 111     Resp 17     Temp 98.2 F (36.8 C)     Temp Source Oral     SpO2 100 %     Weight 178 lb (80.7 kg)     Height 5' 1 (1.549 m)     Head Circumference      Peak Flow       Pain Score 3     Pain Loc      Pain Education      Exclude from Growth Chart     Most recent vital signs: Vitals:   12/10/23 1752 12/10/23 1757  BP: (!) 141/96   Pulse: (!) 109 99  Resp: (!) 21   Temp: 99 F (37.2 C)   SpO2: 100%     Physical Exam Vitals and nursing note reviewed.  Constitutional:      General: Awake and alert. No acute distress.    Appearance: Normal appearance. The patient is normal weight.  HENT:     Head: Normocephalic and atraumatic.     Mouth: Mucous membranes are moist.  Eyes:     General: PERRL. Normal EOMs        Right eye: No discharge.        Left eye: No discharge.     Conjunctiva/sclera: Conjunctivae normal.  Cardiovascular:     Rate and Rhythm: Tachycardic rate and regular rhythm.     Pulses: Normal pulses.  Pulmonary:     Effort: Pulmonary effort is normal. No respiratory distress.     Breath sounds: Normal breath sounds.  Abdominal:  Abdomen is soft. There is no abdominal tenderness. No rebound or guarding. No distention. Musculoskeletal:        General: No swelling. Normal range of motion.     Cervical back: Normal range of motion and neck supple.  Skin:    General: Skin is warm and dry.     Capillary Refill: Capillary refill takes less than 2 seconds.     Findings: No rash.  Neurological:     Mental Status: The patient is awake and alert.      ED Results / Procedures / Treatments   Labs (all labs ordered are listed, but only abnormal results are displayed) Labs Reviewed  CBC - Abnormal; Notable for the following components:      Result Value   Platelets 460 (*)    All other components within normal limits  HEPATIC FUNCTION PANEL - Abnormal; Notable for the following components:   Total Protein 8.2 (*)    AST 52 (*)    All other components within normal limits  URINALYSIS, ROUTINE W REFLEX MICROSCOPIC - Abnormal; Notable for the following components:   Color, Urine YELLOW (*)    APPearance HAZY (*)    Hgb urine  dipstick LARGE (*)    Ketones, ur 5 (*)    Protein, ur 30 (*)    Bacteria, UA RARE (*)    All other components within normal limits  D-DIMER, QUANTITATIVE (NOT AT [facility]) - Abnormal; Notable for the following components:   D-Dimer, Quant 0.72 (*)    All other components within normal limits  ACETAMINOPHEN  LEVEL - Abnormal; Notable for the following components:   Acetaminophen  (Tylenol ), Serum <10 (*)    All other components within normal limits  URINE DRUG SCREEN, QUALITATIVE ([facility] ONLY) - Abnormal; Notable for the following components:   Barbiturates, Ur Screen POSITIVE (*)    Benzodiazepine, Ur Scrn POSITIVE (*)    All other components within normal limits  BASIC METABOLIC PANEL WITH GFR  MAGNESIUM   LIPASE, BLOOD  TSH  PREGNANCY, URINE  POC URINE PREG, ED  TROPONIN I (HIGH SENSITIVITY)  TROPONIN I (HIGH SENSITIVITY)     EKG     RADIOLOGY I independently reviewed and interpreted imaging and agree with radiologists findings.     PROCEDURES:  Critical Care performed:   Procedures   MEDICATIONS ORDERED IN ED: Medications  sodium chloride  0.9 % bolus 1,000 mL (0 mLs Intravenous Stopped 12/10/23 1640)  diazepam  (VALIUM ) tablet 2 mg (2 mg Oral Given 12/10/23 1726)  iohexol  (OMNIPAQUE ) 350 MG/ML injection 75 mL (75 mLs Intravenous Contrast Given 12/10/23 1532)     IMPRESSION / MDM / ASSESSMENT AND PLAN / ED COURSE  I reviewed the triage vital signs and the nursing notes.   Differential diagnosis includes, but is not limited to, drug overdose, drug withdrawal, pulmonary embolism, electrolyte disarray, thyroid  abnormality, dehydration, anxiety.  Patient is awake and alert, tachycardic to 111 on arrival though normotensive and afebrile.  EKG obtained upon arrival reveals sinus tachycardia.  Further workup is indicated.  Labs obtained are overall reassuring.  She has normal H&H, no leukocytosis, normal magnesium , normal electrolytes, normal TSH.  I did obtain a D-dimer given  her tachycardia mildly elevated platelets, this was elevated and therefore CT angiogram obtained for evaluation of pulmonary embolism.  This was negative.  Patient asked me to give personally for anxiety.  I asked if she wanted a benzodiazepine and she said I do not know, I do not take those but that she  wanted to try it.  The nurse then asked me if she could have a benzodiazepine because she took Xanax  earlier today, and when I questioned patient about this given what she had told me before, she reluctantly told me that she did take benzodiazepines.  Therefore, benzodiazepine withdrawal is also possible for her today.  She is also hypertensive, further suggesting benzodiazepine withdrawal.  There are no tongue fasciculations, but she is mildly tremulous.  Her UDS is positive for benzodiazepines.  It is also positive for barbiturates.  She reports that she takes Fioricet as needed, and last took it a couple of days ago.  I did discuss with poison control as well given her concern for NyQuil overdose in conjunction with her Zoloft .  They did not feel this was related.  They did however recommend checking a Tylenol  level in case she is taking more than she tells us .  Tylenol  level came back less than 10, and also her LFTs are normal.  Patient was reevaluated after she was given her Valium  and reports that she feels significantly improved.  Her tremors have resolved.  She remains in sinus tachycardia, though is asymptomatic at this time.  Patient feels significantly proved and feels ready for discharge home. HR after valium  is 99.  I discussed with Dr. Nicholaus who agrees with workup so far, and recommends referral to cardiology.  Patient is in agreement with this plan.  We discussed very strict return precautions and the importance of close outpatient follow-up.  Patient understands and agrees with plan.  Discharged in stable condition plan  Patient's presentation is most consistent with acute presentation  with potential threat to life or bodily function.  Clinical Course as of 12/10/23 1759  [place] Dec 10, 2023  1412 Discussed with poison control (case number [id number], [place] ). Serotonergic actions if has dextromethorphan, anticholinergic. 8 hours effects will wear off. Consider other causes.  Recommend IVF and also Tylenol  level to ensure that she is not actually taking more than she tells us  [JP]  1503 Patient reports that she feels very anxious and she is requesting something to help with her anxiety.  She remains tachycardic to the 110s on the cardiac monitor [JP]  1757 Patient reports that she feels significantly improved after the Valium . [JP]    Clinical Course User Index [JP] Blake Vetrano E, PA-C     FINAL CLINICAL IMPRESSION(S) / ED DIAGNOSES   Final diagnoses:  Tachycardia     Rx / DC Orders   ED Discharge Orders          Ordered    Ambulatory referral to Cardiology        12/10/23 1757             Note:  This document was prepared using Dragon voice recognition software and may include unintentional dictation errors.   Stephanie Mcglone E, PA-C 12/10/23 1759    Nicholaus Rolland BRAVO, MD 12/10/23 [phone]

## 2023-12-11 ENCOUNTER — Other Ambulatory Visit: Payer: Self-pay

## 2023-12-11 ENCOUNTER — Emergency Department (HOSPITAL_COMMUNITY)
Admission: EM | Admit: 2023-12-11 | Discharge: 2023-12-11 | Disposition: A | Discharge status: Home / Self Care | Attending: Emergency Medicine | Admitting: Emergency Medicine

## 2023-12-11 ENCOUNTER — Encounter (HOSPITAL_BASED_OUTPATIENT_CLINIC_OR_DEPARTMENT_OTHER): Payer: Self-pay

## 2023-12-11 ENCOUNTER — Emergency Department (HOSPITAL_BASED_OUTPATIENT_CLINIC_OR_DEPARTMENT_OTHER)
Admission: EM | Admit: 2023-12-11 | Discharge: 2023-12-12 | Disposition: A | Discharge status: Home / Self Care | Source: Home / Self Care | Attending: Emergency Medicine | Admitting: Emergency Medicine

## 2023-12-11 ENCOUNTER — Emergency Department (HOSPITAL_BASED_OUTPATIENT_CLINIC_OR_DEPARTMENT_OTHER)

## 2023-12-11 ENCOUNTER — Emergency Department (HOSPITAL_BASED_OUTPATIENT_CLINIC_OR_DEPARTMENT_OTHER): Admitting: Radiology

## 2023-12-11 DIAGNOSIS — R519 Headache, unspecified: Secondary | ICD-10-CM | POA: Diagnosis not present

## 2023-12-11 DIAGNOSIS — R079 Chest pain, unspecified: Secondary | ICD-10-CM | POA: Diagnosis not present

## 2023-12-11 DIAGNOSIS — J45909 Unspecified asthma, uncomplicated: Secondary | ICD-10-CM | POA: Diagnosis not present

## 2023-12-11 DIAGNOSIS — E860 Dehydration: Secondary | ICD-10-CM | POA: Diagnosis not present

## 2023-12-11 DIAGNOSIS — F419 Anxiety disorder, unspecified: Secondary | ICD-10-CM | POA: Insufficient documentation

## 2023-12-11 DIAGNOSIS — R0789 Other chest pain: Secondary | ICD-10-CM | POA: Insufficient documentation

## 2023-12-11 LAB — HEPATIC FUNCTION PANEL
ALT: 27 U/L (ref 0–44)
AST: 41 U/L (ref 15–41)
Albumin: 4.4 g/dL (ref 3.5–5.0)
Alkaline Phosphatase: 109 U/L (ref 38–126)
Bilirubin, Direct: 0.2 mg/dL (ref 0.0–0.2)
Indirect Bilirubin: 0.3 mg/dL (ref 0.3–0.9)
Total Bilirubin: 0.5 mg/dL (ref 0.0–1.2)
Total Protein: 7.5 g/dL (ref 6.5–8.1)

## 2023-12-11 LAB — TROPONIN T, HIGH SENSITIVITY
Troponin T High Sensitivity: <15 ng/L (ref <19)
Troponin T High Sensitivity: <15 ng/L (ref <19)

## 2023-12-11 LAB — CBC
HCT: 35.9 % — ABNORMAL LOW (ref 36.0–46.0)
Hemoglobin: 12 g/dL (ref 12.0–15.0)
MCH: 29.6 pg (ref 26.0–34.0)
MCHC: 33.4 g/dL (ref 30.0–36.0)
MCV: 88.6 fL (ref 80.0–100.0)
Platelets: 349 K/uL (ref 150–400)
RBC: 4.05 MIL/uL (ref 3.87–5.11)
RDW: 12.7 % (ref 11.5–15.5)
WBC: 7.1 K/uL (ref 4.0–10.5)
nRBC: 0 % (ref 0.0–0.2)

## 2023-12-11 LAB — BASIC METABOLIC PANEL WITH GFR
Anion gap: 17 — ABNORMAL HIGH (ref 5–15)
BUN: 8 mg/dL (ref 6–20)
CO2: 21 mmol/L — ABNORMAL LOW (ref 22–32)
Calcium: 9.7 mg/dL (ref 8.9–10.3)
Chloride: 101 mmol/L (ref 98–111)
Creatinine, Ser: 0.58 mg/dL (ref 0.44–1.00)
GFR, Estimated: >60 mL/min (ref >60)
Glucose, Bld: 85 mg/dL (ref 70–99)
Potassium: 3.9 mmol/L (ref 3.5–5.1)
Sodium: 138 mmol/L (ref 135–145)

## 2023-12-11 LAB — LIPASE, BLOOD: Lipase: 25 U/L (ref 11–51)

## 2023-12-11 MED ORDER — IOHEXOL 350 MG/ML SOLN: 75.0000 mL | Freq: Once | INTRAVENOUS | Status: DC | PRN

## 2023-12-11 MED ORDER — SODIUM CHLORIDE 0.9 % IV BOLUS
1000.0000 mL | Freq: Once | INTRAVENOUS | Status: AC
  Administered 2023-12-11: 1000 mL via INTRAVENOUS

## 2023-12-11 MED ORDER — LORAZEPAM 1 MG PO TABS
1.0000 mg | ORAL_TABLET | Freq: Once | ORAL | Status: AC
  Administered 2023-12-11: 1 mg via ORAL
  Filled 2023-12-11: qty 1

## 2023-12-11 MED ORDER — DIAZEPAM 5 MG/ML IJ SOLN
5.0000 mg | Freq: Once | INTRAMUSCULAR | Status: DC
  Filled 2023-12-11: qty 2

## 2023-12-11 NOTE — ED Provider Notes (Signed)
 I assumed care of this patient from previous provider.  Please see their note for further details of history, exam, and MDM.   Briefly patient is a 28 y.o. female who presented with chest pain and palpitations workup has been reassuring.  Patient is currently awaiting CTA due to a positive dimer during workup yesterday at [facility].  Upon further investigation, patient already had a CTA performed at [facility] regional which was negative.  No need to repeat today.  The patient appears reasonably screened and/or stabilized for discharge and I doubt any other medical condition or other [facility] requiring further screening, evaluation, or treatment in the ED at this time. I have discussed the findings, Dx and Tx plan with the patient/family who expressed understanding and agree(s) with the plan. Discharge instructions discussed at length. The patient/family was given strict return precautions who verbalized understanding of the instructions. No further questions at time of discharge.  Disposition: Discharge  Condition: Good  ED Discharge Orders     None         Follow Up: Vincente Shivers, NP [address] [place] [address] [phone]  Go to  appointment tomorrow  Vincente Shivers, NP [address] [place] [address] [phone]  Go to  as scheduled         Trine Raynell Moder, MD 12/11/23 2353

## 2023-12-11 NOTE — ED Provider Notes (Signed)
 [place] EMERGENCY DEPARTMENT AT [facility] Provider Note   CSN: [id number] Arrival date & time: 12/11/23  2019     Patient presents with: Chest Pain   Danielle Goodman is a 28 y.o. female.   Pt is a 28 yo female with pmhx significant for asthma, adhd, and headaches.  Pt said she drank a lot this weekend.  She said she drank so much she blacked out.  She thinks she accidentally too much ZzzQuil pills, but is not sure how much.  She said her Zoloft  was next to it and is not sure if she took any of those pills.  Pt has had palpitations and bp has been high.  She is convinced she is going to die.  She did go to [facility] ED yesterday and work up neg.  Pt is also concerned that she hit her head while intoxicated because she's been having more headaches. No signs of trauma.       Prior to Admission medications   Medication Sig Start Date End Date Taking? Authorizing Provider  butalbital -acetaminophen -caffeine  (FIORICET) 50-325-40 MG tablet Take 1-2 tablets by mouth every 6 (six) hours as needed for headache. 11/27/23 11/26/24  Jerral Meth, MD  etonogestrel (NEXPLANON) 68 MG IMPL implant 1 each by Subdermal route once. 09/18/23   [provider]  sertraline  (ZOLOFT ) 50 MG tablet Take 1 tablet (50 mg total) by mouth daily. 10/19/23   Vincente Shivers, NP    Allergies: Patient has no known allergies.    Review of Systems  Cardiovascular:  Positive for palpitations.  All other systems reviewed and are negative.   Updated Vital Signs BP (!) 141/101   Pulse 95   Temp 98.4 F (36.9 C) (Oral)   Resp 16   Ht 5' 1 (1.549 m)   Wt 80.7 kg   LMP 11/21/2023 (Approximate)   SpO2 100%   BMI 33.63 kg/m   Physical Exam Vitals and nursing note reviewed.  Constitutional:      Appearance: She is well-developed.  HENT:     Head: Normocephalic and atraumatic.  Eyes:     Extraocular Movements: Extraocular movements intact.     Pupils: Pupils are equal, round, and  reactive to light.  Cardiovascular:     Rate and Rhythm: Normal rate and regular rhythm.     Heart sounds: Normal heart sounds.  Pulmonary:     Effort: Pulmonary effort is normal.     Breath sounds: Normal breath sounds.  Abdominal:     General: Bowel sounds are normal.     Palpations: Abdomen is soft.  Musculoskeletal:        General: Normal range of motion.     Cervical back: Normal range of motion and neck supple.  Skin:    General: Skin is warm.     Capillary Refill: Capillary refill takes less than 2 seconds.  Neurological:     General: No focal deficit present.     Mental Status: She is alert and oriented to person, place, and time.  Psychiatric:        Mood and Affect: Mood is anxious.        Behavior: Behavior normal.     (all labs ordered are listed, but only abnormal results are displayed) Labs Reviewed  BASIC METABOLIC PANEL WITH GFR - Abnormal; Notable for the following components:      Result Value   CO2 21 (*)    Anion gap 17 (*)    All other components  within normal limits  CBC - Abnormal; Notable for the following components:   HCT 35.9 (*)    All other components within normal limits  HEPATIC FUNCTION PANEL  LIPASE, BLOOD  PREGNANCY, URINE  URINALYSIS, ROUTINE W REFLEX MICROSCOPIC  URINE DRUG SCREEN  TROPONIN T, HIGH SENSITIVITY  TROPONIN T, HIGH SENSITIVITY    EKG: None  Radiology: CT Head Wo Contrast Result Date: 12/11/2023 EXAM: CT HEAD WITHOUT 12/11/2023 10:30:00 PM TECHNIQUE: CT of the head was performed without the administration of intravenous contrast. Automated exposure control, iterative reconstruction, and/or weight based adjustment of the mA/kV was utilized to reduce the radiation dose to as low as reasonably achievable. COMPARISON: 10/16/2023 CLINICAL HISTORY: Headache, sudden, severe. Pt presents with CP and elevated BP and black stool for 2 days. Pt is concerned because she consumed a lot of ETOH for several days at the end of last  week and then she accidentally took too much ZZZQuil on Saturday. Pt also states she may have taken Zoloft , but does not remember. Pt denies SI. FINDINGS: BRAIN AND VENTRICLES: No acute intracranial hemorrhage. No mass effect or midline shift. No extra-axial fluid collection. Gray-white differentiation is maintained. No hydrocephalus. ORBITS: No acute abnormality. SINUSES AND MASTOIDS: No acute abnormality. SOFT TISSUES AND SKULL: No acute skull fracture. No acute soft tissue abnormality. IMPRESSION: 1. No acute intracranial abnormality. Electronically signed by: Franky Stanford MD 12/11/2023 10:51 PM EDT RP Workstation: [id number]   DG Chest 2 View Result Date: 12/11/2023 CLINICAL DATA:  Chest pain. EXAM: CHEST - 2 VIEW COMPARISON:  Radiograph and CT yesterday FINDINGS: The cardiomediastinal contours are normal. The lungs are clear. Pulmonary vasculature is normal. No consolidation, pleural effusion, or pneumothorax. No acute osseous abnormalities are seen. IMPRESSION: Negative radiographs of the chest. Electronically Signed   By: Andrea Gasman M.D.   On: 12/11/2023 21:39   CT Angio Chest PE W/Cm &/Or Wo Cm Result Date: 12/10/2023 CLINICAL DATA:  Palpitations since this morning and feeling ill. Positive D-dimer. Low to intermediate probability for pulmonary embolism. EXAM: CT ANGIOGRAPHY CHEST WITH CONTRAST TECHNIQUE: Multidetector CT imaging of the chest was performed using the standard protocol during bolus administration of intravenous contrast. Multiplanar CT image reconstructions and MIPs were obtained to evaluate the vascular anatomy. RADIATION DOSE REDUCTION: This exam was performed according to the departmental dose-optimization program which includes automated exposure control, adjustment of the mA and/or kV according to patient size and/or use of iterative reconstruction technique. CONTRAST:  75mL OMNIPAQUE  IOHEXOL  350 MG/ML SOLN COMPARISON:  06/17/2021 FINDINGS: Cardiovascular: Heart is normal size.  Thoracic aorta is normal in caliber. Pulmonary arterial system is adequately opacified. No evidence of pulmonary emboli. Remaining vascular structures are unremarkable. Mediastinum/Nodes: No mediastinal or hilar adenopathy. Remaining mediastinal structures are normal. Lungs/Pleura: Lungs are adequately inflated without focal airspace consolidation or effusion. Airways are normal. Upper Abdomen: No acute findings. Musculoskeletal: No focal abnormality. Review of the MIP images confirms the above findings. IMPRESSION: No acute cardiopulmonary disease and no evidence of pulmonary embolism. Electronically Signed   By: Toribio Agreste M.D.   On: 12/10/2023 15:59   DG Chest 2 View Result Date: 12/10/2023 CLINICAL DATA:  Palpitations.  Dizziness. EXAM: CHEST - 2 VIEW COMPARISON:  10/02/2023. FINDINGS: Bilateral lung fields are clear. Bilateral costophrenic angles are clear. Normal cardio-mediastinal silhouette. No acute osseous abnormalities. The soft tissues are within normal limits. IMPRESSION: No active cardiopulmonary disease. Electronically Signed   By: Ree Molt M.D.   On: 12/10/2023 13:38  Procedures   Medications Ordered in the ED  LORazepam  (ATIVAN ) tablet 1 mg (has no administration in time range)  sodium chloride  0.9 % bolus 1,000 mL (1,000 mLs Intravenous New Bag/Given 12/11/23 2240)                                    Medical Decision Making Amount and/or Complexity of Data Reviewed Labs: ordered. Radiology: ordered.  Risk Prescription drug management.   This patient presents to the ED for concern of palpitations, this involves an extensive number of treatment options, and is a complaint that carries with it a high risk of complications and morbidity.  The differential diagnosis includes drug od, electrolyte abn, infection   Co morbidities that complicate the patient evaluation  asthma, adhd, and headaches   Additional history obtained:  Additional history obtained from  epic chart review  Lab Tests:  I Ordered, and personally interpreted labs.  The pertinent results include:  cbc nl, bmp nl, trop nl,    Imaging Studies ordered:  I ordered imaging studies including ct head, cxr  I independently visualized and interpreted imaging which showed  CT head: No acute intracranial abnormality.  CXR: Negative radiographs of the chest.  I agree with the radiologist interpretation   Cardiac Monitoring:  The patient was maintained on a cardiac monitor.  I personally viewed and interpreted the cardiac monitored which showed an underlying rhythm of: nsr   Medicines ordered and prescription drug management:  I ordered medication including ivfs  for sx  Reevaluation of the patient after these medicines showed that the patient improved I have reviewed the patients home medicines and have made adjustments as needed   Test Considered:  ct   Critical Interventions:  ivfs   Problem List / ED Course:  Palpitations:  likely due to significant anxiety.  She did have a + ddimer at [facility], so I will add on a CT chest.  If that is negative, she can go home.  She has an appt tomorrow with her pcp to review meds for anxiety as the Zoloft  makes her feel bad.   Reevaluation:  After the interventions noted above, I reevaluated the patient and found that they have :improved   Social Determinants of Health:  Lives at home   Dispostion:      Final diagnoses:  Dehydration  Atypical chest pain  Anxiety    ED Discharge Orders     None          Dean Clarity, MD 12/11/23 2329

## 2023-12-11 NOTE — ED Triage Notes (Addendum)
 Pt presents with CP and elevated BP and black stool for 2 days. Pt is concerned because she consumed a lot of ETOH for several days at the end of last week and then she accidentally took  too much ZZZQuil on Saturday. Pt also states she may have taken Zoloft , but does not remember. Pt denies SI.

## 2023-12-12 ENCOUNTER — Ambulatory Visit (INDEPENDENT_AMBULATORY_CARE_PROVIDER_SITE_OTHER): Admitting: General Practice

## 2023-12-12 ENCOUNTER — Encounter: Payer: Self-pay | Admitting: General Practice

## 2023-12-12 VITALS — BP 124/82 | HR 84 | Temp 98.0°F | Ht 61.0 in | Wt 166.0 lb

## 2023-12-12 DIAGNOSIS — Z09 Encounter for follow-up examination after completed treatment for conditions other than malignant neoplasm: Secondary | ICD-10-CM

## 2023-12-12 DIAGNOSIS — R030 Elevated blood-pressure reading, without diagnosis of hypertension: Secondary | ICD-10-CM | POA: Diagnosis not present

## 2023-12-12 DIAGNOSIS — Z131 Encounter for screening for diabetes mellitus: Secondary | ICD-10-CM | POA: Diagnosis not present

## 2023-12-12 DIAGNOSIS — Z13220 Encounter for screening for lipoid disorders: Secondary | ICD-10-CM | POA: Diagnosis not present

## 2023-12-12 DIAGNOSIS — F419 Anxiety disorder, unspecified: Secondary | ICD-10-CM | POA: Diagnosis not present

## 2023-12-12 DIAGNOSIS — Z0000 Encounter for general adult medical examination without abnormal findings: Secondary | ICD-10-CM | POA: Insufficient documentation

## 2023-12-12 DIAGNOSIS — Z0001 Encounter for general adult medical examination with abnormal findings: Secondary | ICD-10-CM | POA: Diagnosis not present

## 2023-12-12 LAB — LIPID PANEL
Cholesterol: 153 mg/dL (ref 0–200)
HDL: 66 mg/dL (ref >39.00)
LDL Cholesterol: 75 mg/dL (ref 0–99)
NonHDL: 87.41
Total CHOL/HDL Ratio: 2
Triglycerides: 62 mg/dL (ref 0.0–149.0)
VLDL: 12.4 mg/dL (ref 0.0–40.0)

## 2023-12-12 LAB — HEMOGLOBIN A1C: Hgb A1c MFr Bld: 5.7 % (ref 4.6–6.5)

## 2023-12-12 MED ORDER — HYDROXYZINE PAMOATE 25 MG PO CAPS: 25.0000 mg | ORAL_CAPSULE | Freq: Three times a day (TID) | ORAL | 0 refills | Status: DC | PRN

## 2023-12-12 MED ORDER — ESCITALOPRAM OXALATE 5 MG PO TABS: 5.0000 mg | ORAL_TABLET | Freq: Every day | ORAL | 0 refills | Status: DC

## 2023-12-12 NOTE — Assessment & Plan Note (Signed)
 Reviewed hospital notes, labs, imaging from 12/10/2023 and 12/11/2023.

## 2023-12-12 NOTE — Assessment & Plan Note (Signed)
 Multiple readings of elevated blood pressure at the hospital. Handout given to patient to monitor blood pressures at home for 1 week and send it via MyChart. Discussed the importance of monitoring diet and exercise. Will continue to monitor.

## 2023-12-12 NOTE — Patient Instructions (Addendum)
 Stop by the lab prior to leaving today. I will notify you of your results once received.   Start monitoring your blood pressure daily, around the same time of day, for the next 2-3 weeks.  Ensure that you have rested for 30 minutes prior to checking your blood pressure.   Record your readings and notify me if you see numbers consistently at or above 130 on top and/or 90 on bottom.  Send me your readings in 1-2 weeks via mychart.   Start Hydroxyzine  25mg  as needed for panic attacks.   Start Lexapro  5 mg once daily and then increase to 10 mg thereafter.   Follow up in 4 weeks for BP and anxiety.   It was a pleasure to see you today!

## 2023-12-12 NOTE — Assessment & Plan Note (Addendum)
 Uncontrolled. Follow-up provided to patient to schedule therapy appointment.  Discussed the importance of medication adherence. Discontinue Zoloft  as it was given her bad dreams. Start Lexapro  5 mg for 7 days and then increase to 10 mg thereafter.  Rx sent. Start hydroxyzine  25 mg every 8 hours as needed for panic attacks.  Rx sent.  Handout provided.  Follow-up in 4 weeks.

## 2023-12-12 NOTE — Progress Notes (Addendum)
 Established Patient Office Visit  Subjective   Patient ID: Danielle Goodman, female    DOB: December 19, 1995  Age: 28 y.o. MRN: [id number]  Chief Complaint  Patient presents with  . Annual Exam    Pap done couple months ago; records requested from OBGYN.     HPI  Danielle Goodman is a 28 year old female with past medical history of migraine, anxiety presents today for complete physical and hospital follow up and chronic conditions.  Immunizations: -Tetanus: Completed in 2023  Diet: Fair diet.  Exercise: No regular exercise.  Eye exam: Completes annually  Dental exam: Completed several year ago.  Pap Smear: Completed in 2025 records requested.  Family hx of breast cancer with Paternal great grandmother. She was diagnosed in her 53s.  Hospital follow-up: Was evaluated at the ER on 12/10/2023 and 12/11/2023 for tachycardia and dehydration.  Patient reported that she was having palpitations, feeling disoriented, feeling like her heart is beating out of her chest.  Reports that she took some NyQuil to help her sleep along with her Zoloft  so she was not sure if she overdosed.  She was feeling very anxious.  When she was evaluated at the ER yesterday she reported that she had drank a lot this past weekend until she blacked out.  She had also taken unknown amount of ZzzQuil pills and was not sure if she had overdosed the day before because her blood pressure had been high and she had been having palpitations.  It was found that palpitations were probably secondary to her anxiety.  She did have a positive D-dimer at [facility] for which she had a CT chest which was negative.  She was asked to follow-up with cardiology and primary care. Scheduled to see the cardiologist on 12/26/23.  Blood pressure readings: She had multiple readings of elevated blood pressure at the ER visits.  She has not been checking her blood pressure at home.  She did fall and hit her head and was not sure if that is causing her headaches  or is having headaches related to elevated blood pressure.  Today she denies any chest pain, shortness of breath or difficulty breathing, blurred vision, unilateral weakness.   Anxiety: She was evaluated on 10/19/2023 and was started on sertraline  25 mg for 7 days and then was asked to increase to 50 mg thereafter. She was also referred to therapy.  Today she reports that she gets bursts of panic attacks. She starts shaking to the point where she can't stop shaking. She did take her zoloft  for three weeks but she stopped it due to bad dreams. She had been sober for four months prior to this weekend when she drank until she blacked out. She was then evaluated at the ER twice.  She has not made her appointment with the therapist.  She denies SI/HI.  Patient Active Problem List   Diagnosis Date Noted  . Encounter for screening and preventative care 12/12/2023  . Hospital discharge follow-up 12/12/2023  . Elevated BP without diagnosis of hypertension 12/12/2023  . Anxiety 10/19/2023  . Migraine without status migrainosus, not intractable 10/19/2023  . Alcohol intoxication (HCC) 06/07/2021   Past Medical History:  Diagnosis Date  . ADHD (attention deficit hyperactivity disorder)   . Asthma   . Headache    Migrainse with pregnancty  . SVD (spontaneous vaginal delivery) 03/17/2016   Past Surgical History:  Procedure Laterality Date  . ADENOIDECTOMY    . LAPAROSCOPIC APPENDECTOMY N/A 02/12/2021  Procedure: APPENDECTOMY LAPAROSCOPIC;  Surgeon: Desiderio Schanz, MD;  Location: [facility] ORS;  Service: General;  Laterality: N/A;  . ORIF ANKLE FRACTURE Right 06/18/2021   Procedure: OPEN REDUCTION INTERNAL FIXATION (ORIF) ANKLE FRACTURE;  Surgeon: Kendal Franky SQUIBB, MD;  Location: [facility] OR;  Service: Orthopedics;  Laterality: Right;  . ORIF FEMUR FRACTURE Left 06/18/2021   Procedure: OPEN REDUCTION INTERNAL FIXATION (ORIF) DISTAL FEMUR FRACTURE;  Surgeon: Kendal Franky SQUIBB, MD;  Location: [facility] OR;  Service: Orthopedics;   Laterality: Left;  . TONSILLECTOMY     No Known Allergies       12/12/2023   11:01 AM 10/19/2023   10:57 AM  Depression screen PHQ 2/9  Decreased Interest 1 0  Down, Depressed, Hopeless 2 0  PHQ - 2 Score 3 0  Altered sleeping 3   Tired, decreased energy 3   Change in appetite 3   Feeling bad or failure about yourself  3   Trouble concentrating 3   Moving slowly or fidgety/restless 3   Suicidal thoughts 0   PHQ-9 Score 21   Difficult doing work/chores Very difficult        12/12/2023   11:01 AM 10/19/2023   10:57 AM  GAD 7 : Generalized Anxiety Score  Nervous, Anxious, on Edge 3 3  Control/stop worrying 3 3  Worry too much - different things 3 3  Trouble relaxing 3 3  Restless 3 1  Easily annoyed or irritable 3 0  Afraid - awful might happen 3 0  Total GAD 7 Score 21 13  Anxiety Difficulty Very difficult Very difficult      Review of Systems  Constitutional:  Negative for chills, fever, malaise/fatigue and weight loss.  HENT:  Negative for congestion, ear discharge, ear pain, hearing loss, nosebleeds, sinus pain, sore throat and tinnitus.   Eyes:  Negative for blurred vision, double vision, pain, discharge and redness.  Respiratory:  Negative for cough, shortness of breath, wheezing and stridor.   Cardiovascular:  Negative for chest pain, palpitations and leg swelling.  Gastrointestinal:  Negative for abdominal pain, constipation, diarrhea, heartburn, nausea and vomiting.  Genitourinary:  Negative for dysuria, frequency and urgency.  Musculoskeletal:  Negative for myalgias.  Skin:  Negative for rash.  Neurological:  Negative for dizziness, tingling, seizures, weakness and headaches.  Psychiatric/Behavioral:  Negative for depression, substance abuse and suicidal ideas. The patient is not nervous/anxious.       Objective:     BP 124/82   Pulse 84   Temp 98 F (36.7 C) (Oral)   Ht 5' 1 (1.549 m)   Wt 166 lb (75.3 kg)   LMP 11/21/2023 (Approximate)   SpO2 98%    BMI 31.37 kg/m  BP Readings from Last 3 Encounters:  12/26/23 (!) 100/50  12/12/23 124/82  12/11/23 (!) 158/99   Wt Readings from Last 3 Encounters:  12/26/23 166 lb 3.2 oz (75.4 kg)  12/12/23 166 lb (75.3 kg)  12/11/23 178 lb (80.7 kg)      Physical Exam Vitals and nursing note reviewed.  Constitutional:      Appearance: Normal appearance.  HENT:     Head: Normocephalic and atraumatic.     Right Ear: Tympanic membrane, ear canal and external ear normal.     Left Ear: Tympanic membrane, ear canal and external ear normal.     Nose: Nose normal.     Mouth/Throat:     Mouth: Mucous membranes are moist.     Pharynx: Oropharynx is clear.  Eyes:     Conjunctiva/sclera: Conjunctivae normal.     Pupils: Pupils are equal, round, and reactive to light.  Cardiovascular:     Rate and Rhythm: Normal rate and regular rhythm.     Pulses: Normal pulses.     Heart sounds: Normal heart sounds.  Pulmonary:     Effort: Pulmonary effort is normal.     Breath sounds: Normal breath sounds.  Abdominal:     General: Abdomen is flat. Bowel sounds are normal.     Palpations: Abdomen is soft.  Musculoskeletal:        General: Normal range of motion.     Cervical back: Normal range of motion.  Skin:    General: Skin is warm and dry.     Capillary Refill: Capillary refill takes less than 2 seconds.  Neurological:     General: No focal deficit present.     Mental Status: She is alert and oriented to person, place, and time. Mental status is at baseline.  Psychiatric:        Mood and Affect: Mood normal.        Behavior: Behavior normal.        Thought Content: Thought content normal.        Judgment: Judgment normal.      Results for orders placed or performed in visit on 12/12/23  HM PAP SMEAR  Result Value Ref Range   HM Pap smear Positive    HPV 16/18/45 genotyping Positive   Results for orders placed or performed in visit on 12/12/23  Lipid panel  Result Value Ref Range    Cholesterol 153 0 - 200 mg/dL   Triglycerides 37.9 0.0 - 149.0 mg/dL   HDL 33.99 >60.99 mg/dL   VLDL 87.5 0.0 - 59.9 mg/dL   LDL Cholesterol 75 0 - 99 mg/dL   Total CHOL/HDL Ratio 2    NonHDL 87.41   Hemoglobin A1c  Result Value Ref Range   Hgb A1c MFr Bld 5.7 4.6 - 6.5 %  Results for orders placed or performed during the hospital encounter of 12/11/23  Basic metabolic panel  Result Value Ref Range   Sodium 138 135 - 145 mmol/L   Potassium 3.9 3.5 - 5.1 mmol/L   Chloride 101 98 - 111 mmol/L   CO2 21 (L) 22 - 32 mmol/L   Glucose, Bld 85 70 - 99 mg/dL   BUN 8 6 - 20 mg/dL   Creatinine, Ser 9.41 0.44 - 1.00 mg/dL   Calcium  9.7 8.9 - 10.3 mg/dL   GFR, Estimated >39 >39 mL/min   Anion gap 17 (H) 5 - 15  CBC  Result Value Ref Range   WBC 7.1 4.0 - 10.5 K/uL   RBC 4.05 3.87 - 5.11 MIL/uL   Hemoglobin 12.0 12.0 - 15.0 g/dL   HCT 64.0 (L) 63.9 - 53.9 %   MCV 88.6 80.0 - 100.0 fL   MCH 29.6 26.0 - 34.0 pg   MCHC 33.4 30.0 - 36.0 g/dL   RDW 87.2 88.4 - 84.4 %   Platelets 349 150 - 400 K/uL   nRBC 0.0 0.0 - 0.2 %  Hepatic function panel  Result Value Ref Range   Total Protein 7.5 6.5 - 8.1 g/dL   Albumin  4.4 3.5 - 5.0 g/dL   AST 41 15 - 41 U/L   ALT 27 0 - 44 U/L   Alkaline Phosphatase 109 38 - 126 U/L   Total Bilirubin 0.5 0.0 - 1.2 mg/dL  Bilirubin, Direct 0.2 0.0 - 0.2 mg/dL   Indirect Bilirubin 0.3 0.3 - 0.9 mg/dL  Lipase, blood  Result Value Ref Range   Lipase 25 11 - 51 U/L  Troponin T, High Sensitivity  Result Value Ref Range   Troponin T High Sensitivity <15 <19 ng/L  Troponin T, High Sensitivity  Result Value Ref Range   Troponin T High Sensitivity <15 <19 ng/L       The ASCVD Risk score (Arnett DK, et al., 2019) failed to calculate for the following reasons:   The 2019 ASCVD risk score is only valid for ages 33 to 39    Assessment & Plan:  Encounter for screening and preventative care Assessment & Plan: Immunizations UTD. Pap smear UTD.  Discussed  the importance of a healthy diet and regular exercise in order for weight loss, and to reduce the risk of further co-morbidity.  Exam stable. Labs pending.  Follow up in 1 year for repeat physical.   Orders: -     Lipid panel -     Hemoglobin A1c; Future  Anxiety Assessment & Plan: Uncontrolled. Follow-up provided to patient to schedule therapy appointment.  Discussed the importance of medication adherence. Discontinue Zoloft  as it was given her bad dreams. Start Lexapro  5 mg for 7 days and then increase to 10 mg thereafter.  Rx sent. Start hydroxyzine  25 mg every 8 hours as needed for panic attacks.  Rx sent.  Handout provided.  Follow-up in 4 weeks.   Hospital discharge follow-up Assessment & Plan: Reviewed hospital notes, labs, imaging from 12/10/2023 and 12/11/2023.   Elevated BP without diagnosis of hypertension Assessment & Plan: Multiple readings of elevated blood pressure at the hospital. Handout given to patient to monitor blood pressures at home for 1 week and send it via MyChart. Discussed the importance of monitoring diet and exercise. Will continue to monitor.      Return in about 4 weeks (around 01/09/2024) for BP and anxiety.    Carrol Aurora, NP

## 2023-12-12 NOTE — Assessment & Plan Note (Signed)
Immunizations UTD. Pap smear UTD  Discussed the importance of a healthy diet and regular exercise in order for weight loss, and to reduce the risk of further co-morbidity.  Exam stable. Labs pending.  Follow up in 1 year for repeat physical.  

## 2023-12-13 ENCOUNTER — Telehealth: Payer: Self-pay | Admitting: General Practice

## 2023-12-13 ENCOUNTER — Ambulatory Visit: Payer: Self-pay | Admitting: General Practice

## 2023-12-13 NOTE — Telephone Encounter (Signed)
 Called and spoke with patient and advised to go to ED if she still feels bad for full evaluation. Patient declines going to ED. States she feels better but just feels foggy headed; papulations are better and doesn't feel like she needs to go back to the ED. Patient states they already did a full work up on her and did not find anything wrong with her. Patient did say if she still felt this way tomorrow that she would go back.

## 2023-12-13 NOTE — Telephone Encounter (Signed)
 Copied from CRM #[id number]. Topic: General - Other >> Dec 13, 2023 12:54 PM Aleatha C wrote: Reason for CRM: Patient had a question about her appointment from yesterday and would like a call back from NP Vincente

## 2023-12-13 NOTE — Telephone Encounter (Signed)
 Further documentation in mychart message from patient.

## 2023-12-20 ENCOUNTER — Telehealth: Payer: Self-pay

## 2023-12-20 ENCOUNTER — Encounter: Payer: Self-pay | Admitting: General Practice

## 2023-12-20 DIAGNOSIS — F419 Anxiety disorder, unspecified: Secondary | ICD-10-CM

## 2023-12-20 MED ORDER — HYDROXYZINE HCL 10 MG PO TABS: 10.0000 mg | ORAL_TABLET | Freq: Every evening | ORAL | 0 refills | Status: DC | PRN

## 2023-12-20 NOTE — Telephone Encounter (Signed)
 Do you want her to come back in or okay to change to something else?

## 2023-12-20 NOTE — Telephone Encounter (Signed)
 Spoke with patient about dose change and patient is okay with this. Patient states she is not going to try it tonight but maybe tomorrow night when the higher dose is more out of her system. She does have an appt with the cardiologist on 12/26/23 but will call back sooner if needed to follow up here.

## 2023-12-20 NOTE — Telephone Encounter (Signed)
 Copied from CRM #[id number]. Topic: Clinical - Medication Question >> Dec 20, 2023  8:23 AM Macario HERO wrote: Reason for CRM: Patient stated she was prescribed hydrOXYzine  (VISTARIL ) 25 MG capsule [[id number]] for anxiety and because she has a fast heart rate, it makes it worse when she takes the medication. Patient is looking to try a new prescription that won't increase her heart rate.

## 2023-12-20 NOTE — Addendum Note (Signed)
 Addended by: VINCENTE SHIVERS on: 12/20/2023 08:39 AM   Modules accepted: Orders

## 2023-12-26 ENCOUNTER — Ambulatory Visit: Attending: Cardiology | Admitting: Cardiology

## 2023-12-26 ENCOUNTER — Encounter: Payer: Self-pay | Admitting: Cardiology

## 2023-12-26 ENCOUNTER — Ambulatory Visit

## 2023-12-26 VITALS — BP 100/50 | HR 76 | Ht 61.0 in | Wt 166.2 lb

## 2023-12-26 DIAGNOSIS — R002 Palpitations: Secondary | ICD-10-CM

## 2023-12-26 NOTE — Patient Instructions (Signed)
 Medication Instructions:  Your physician recommends that you continue on your current medications as directed. Please refer to the Current Medication list given to you today.   *If you need a refill on your cardiac medications before your next appointment, please call your pharmacy*  Lab Work: No labs ordered today  If you have labs (blood work) drawn today and your tests are completely normal, you will receive your results only by: MyChart Message (if you have MyChart) OR A paper copy in the mail If you have any lab test that is abnormal or we need to change your treatment, we will call you to review the results.  Testing/Procedures: Your physician has recommended that you wear a Zio monitor.   This monitor is a medical device that records the heart's electrical activity. Doctors most often use these monitors to diagnose arrhythmias. Arrhythmias are problems with the speed or rhythm of the heartbeat. The monitor is a small device applied to your chest. You can wear one while you do your normal daily activities. While wearing this monitor if you have any symptoms to push the button and record what you felt. Once you have worn this monitor for the period of time provider prescribed (Usually 14 days), you will return the monitor device in the postage paid box. Once it is returned they will download the data collected and provide us  with a report which the provider will then review and we will call you with those results. Important tips:  Avoid showering during the first 24 hours of wearing the monitor. Avoid excessive sweating to help maximize wear time. Do not submerge the device, no hot tubs, and no swimming pools. Keep any lotions or oils away from the patch. After 24 hours you may shower with the patch on. Take brief showers with your back facing the shower head.  Do not remove patch once it has been placed because that will interrupt data and decrease adhesive wear time. Push the button when  you have any symptoms and write down what you were feeling. Once you have completed wearing your monitor, remove and place into box which has postage paid and place in your outgoing mailbox.  If for some reason you have misplaced your box then call our office and we can provide another box and/or mail it off for you.   Follow-Up: At [facility], you and your health needs are our priority.  As part of our continuing mission to provide you with exceptional heart care, our providers are all part of one team.  This team includes your primary Cardiologist (physician) and Advanced Practice Providers or APPs (Physician Assistants and Nurse Practitioners) who all work together to provide you with the care you need, when you need it.  Your next appointment:   6 week(s)  Provider:   You may see Dr. Darliss or one of the following Advanced Practice Providers on your designated Care Team:   Lonni Meager, NP Lesley Maffucci, PA-C Bernardino Bring, PA-C Cadence [place], PA-C Tylene Lunch, NP Barnie Hila, NP    We recommend signing up for the patient portal called MyChart.  Sign up information is provided on this After Visit Summary.  MyChart is used to connect with patients for Virtual Visits (Telemedicine).  Patients are able to view lab/test results, encounter notes, upcoming appointments, etc.  Non-urgent messages can be sent to your provider as well.   To learn more about what you can do with MyChart, go to [email or web address].

## 2023-12-26 NOTE — Progress Notes (Signed)
 Cardiology Office Note:    Date:  12/26/2023   ID:  Danielle Goodman, DOB Jul 02, 1995, MRN [id number]  PCP:  Vincente Shivers, NP   [facility] Providers Cardiologist:  None     Referring MD: Poggi, Jenna E, PA-C   Chief Complaint  Patient presents with   Follow-up    New pt ED follow up pat has been doing well with no complaints of chest pain, chest pressure or SOB, medciation reviewed verbally with patient    History of Present Illness:    Danielle Goodman is a 28 y.o. female with a hx of migraine, anxiety who presents with palpitations.  Presented to the ED about 2 weeks ago with symptoms of palpitations, workup was unrevealing.  States having symptoms of palpitations ongoing over the past month or so.  Symptoms are not associated with exertion.  Symptoms last anywhere from a few minutes to an hour.  Last episode was 2 days ago.  Symptoms alcohol almost daily.  In the ED, EKG showed sinus tachycardia heart rate 101.  Troponins was normal.  Past Medical History:  Diagnosis Date   ADHD (attention deficit hyperactivity disorder)    Asthma    Headache    Migrainse with pregnancty   SVD (spontaneous vaginal delivery) 03/17/2016    Past Surgical History:  Procedure Laterality Date   ADENOIDECTOMY     LAPAROSCOPIC APPENDECTOMY N/A 02/12/2021   Procedure: APPENDECTOMY LAPAROSCOPIC;  Surgeon: Desiderio Schanz, MD;  Location: [facility] ORS;  Service: General;  Laterality: N/A;   ORIF ANKLE FRACTURE Right 06/18/2021   Procedure: OPEN REDUCTION INTERNAL FIXATION (ORIF) ANKLE FRACTURE;  Surgeon: Kendal Franky SQUIBB, MD;  Location: [facility] OR;  Service: Orthopedics;  Laterality: Right;   ORIF FEMUR FRACTURE Left 06/18/2021   Procedure: OPEN REDUCTION INTERNAL FIXATION (ORIF) DISTAL FEMUR FRACTURE;  Surgeon: Kendal Franky SQUIBB, MD;  Location: [facility] OR;  Service: Orthopedics;  Laterality: Left;   TONSILLECTOMY      Current Medications: Current Meds  Medication Sig    butalbital -acetaminophen -caffeine  (FIORICET) 50-325-40 MG tablet Take 1-2 tablets by mouth every 6 (six) hours as needed for headache.   escitalopram  (LEXAPRO ) 5 MG tablet Take 1 tablet (5 mg total) by mouth daily.   etonogestrel (NEXPLANON) 68 MG IMPL implant 1 each by Subdermal route once.   hydrOXYzine  (ATARAX ) 10 MG tablet Take 1 tablet (10 mg total) by mouth at bedtime as needed for anxiety.     Allergies:   Patient has no known allergies.   Social History   Socioeconomic History   Marital status: Married    Spouse name: Not on file   Number of children: Not on file   Years of education: Not on file   Highest education level: Not on file  Occupational History   Not on file  Tobacco Use   Smoking status: Former   Smokeless tobacco: Never   Tobacco comments:    vapes  Vaping Use   Vaping status: Every Day  Substance and Sexual Activity   Alcohol use: Yes   Drug use: No   Sexual activity: Yes    Birth control/protection: None, Implant  Other Topics Concern   Not on file  Social History Narrative   Not on file   Social Drivers of Health   Financial Resource Strain: Low Risk  (10/30/2023)   Received from [facility]   Overall Financial Resource Strain (CARDIA)    Difficulty of Paying Living Expenses: Not hard at all  Food  Insecurity: Food Insecurity Present (10/30/2023)   Received from [facility]   Hunger Vital Sign    Within the past 12 months, you worried that your food would run out before you got the money to buy more.: Sometimes true    Within the past 12 months, the food you bought just didn't last and you didn't have money to get more.: Never true  Transportation Needs: No Transportation Needs (10/30/2023)   Received from [facility] - Transportation    In the past 12 months, has lack of transportation kept you from medical appointments or from getting medications?: No    Lack of Transportation  (Non-Medical): No  Physical Activity: Not on file  Stress: Not on file  Social Connections: Not on file     Family History: The patient's family history includes Heart attack in her maternal grandfather and maternal grandmother; Hyperlipidemia in her sister; Hypertension in her mother.  ROS:   Please see the history of present illness.     All other systems reviewed and are negative.  EKGs/Labs/Other Studies Reviewed:    The following studies were reviewed today:  EKG Interpretation Date/Time:  Tuesday December 26 2023 09:44:02 EDT Ventricular Rate:  76 PR Interval:  186 QRS Duration:  74 QT Interval:  348 QTC Calculation: 391 R Axis:   19  Text Interpretation: Normal sinus rhythm Normal ECG Confirmed by Darliss Rogue ([id number]) on 12/26/2023 9:49:06 AM    Recent Labs: 12/10/2023: Magnesium  1.9; TSH 0.598 12/11/2023: ALT 27; BUN 8; Creatinine, Ser 0.58; Hemoglobin 12.0; Platelets 349; Potassium 3.9; Sodium 138  Recent Lipid Panel    Component Value Date/Time   CHOL 153 12/12/2023 1101   TRIG 62.0 12/12/2023 1101   HDL 66.00 12/12/2023 1101   CHOLHDL 2 12/12/2023 1101   VLDL 12.4 12/12/2023 1101   LDLCALC 75 12/12/2023 1101     Risk Assessment/Calculations:             Physical Exam:    VS:  BP (!) 100/50 (BP Location: Left Arm, Patient Position: Sitting, Cuff Size: Normal)   Pulse 76   Ht 5' 1 (1.549 m)   Wt 166 lb 3.2 oz (75.4 kg)   SpO2 99%   BMI 31.40 kg/m     Wt Readings from Last 3 Encounters:  12/26/23 166 lb 3.2 oz (75.4 kg)  12/12/23 166 lb (75.3 kg)  12/11/23 178 lb (80.7 kg)     GEN:  Well nourished, well developed in no acute distress HEENT: Normal NECK: No JVD; No carotid bruits CARDIAC: RRR, no murmurs, rubs, gallops RESPIRATORY:  Clear to auscultation without rales, wheezing or rhonchi  ABDOMEN: Soft, non-tender, non-distended MUSCULOSKELETAL:  No edema; No deformity  SKIN: Warm and dry NEUROLOGIC:  Alert and oriented x 3 PSYCHIATRIC:   Normal affect   ASSESSMENT:    1. Palpitations    PLAN:    In order of problems listed above:  Palpitations, will order cardiac monitor to evaluate any significant arrhythmias.  Overall patient appears low risk from a cardiac perspective.  Reassured patient if no significant arrhythmias noted.  Follow-up after monitor       Medication Adjustments/Labs and Tests Ordered: Current medicines are reviewed at length with the patient today.  Concerns regarding medicines are outlined above.  Orders Placed This Encounter  Procedures   LONG TERM MONITOR (3-14 DAYS)   EKG 12-Lead   No orders of the defined types were placed in this encounter.  Patient Instructions  Medication Instructions:  Your physician recommends that you continue on your current medications as directed. Please refer to the Current Medication list given to you today.   *If you need a refill on your cardiac medications before your next appointment, please call your pharmacy*  Lab Work: No labs ordered today  If you have labs (blood work) drawn today and your tests are completely normal, you will receive your results only by: MyChart Message (if you have MyChart) OR A paper copy in the mail If you have any lab test that is abnormal or we need to change your treatment, we will call you to review the results.  Testing/Procedures: Your physician has recommended that you wear a Zio monitor.   This monitor is a medical device that records the heart's electrical activity. Doctors most often use these monitors to diagnose arrhythmias. Arrhythmias are problems with the speed or rhythm of the heartbeat. The monitor is a small device applied to your chest. You can wear one while you do your normal daily activities. While wearing this monitor if you have any symptoms to push the button and record what you felt. Once you have worn this monitor for the period of time provider prescribed (Usually 14 days), you will return the  monitor device in the postage paid box. Once it is returned they will download the data collected and provide us  with a report which the provider will then review and we will call you with those results. Important tips:  Avoid showering during the first 24 hours of wearing the monitor. Avoid excessive sweating to help maximize wear time. Do not submerge the device, no hot tubs, and no swimming pools. Keep any lotions or oils away from the patch. After 24 hours you may shower with the patch on. Take brief showers with your back facing the shower head.  Do not remove patch once it has been placed because that will interrupt data and decrease adhesive wear time. Push the button when you have any symptoms and write down what you were feeling. Once you have completed wearing your monitor, remove and place into box which has postage paid and place in your outgoing mailbox.  If for some reason you have misplaced your box then call our office and we can provide another box and/or mail it off for you.   Follow-Up: At [facility], you and your health needs are our priority.  As part of our continuing mission to provide you with exceptional heart care, our providers are all part of one team.  This team includes your primary Cardiologist (physician) and Advanced Practice Providers or APPs (Physician Assistants and Nurse Practitioners) who all work together to provide you with the care you need, when you need it.  Your next appointment:   6 week(s)  Provider:   You may see Dr. Darliss or one of the following Advanced Practice Providers on your designated Care Team:   Lonni Meager, NP Lesley Maffucci, PA-C Bernardino Bring, PA-C Cadence [place], PA-C Tylene Lunch, NP Barnie Hila, NP    We recommend signing up for the patient portal called MyChart.  Sign up information is provided on this After Visit Summary.  MyChart is used to connect with patients for Virtual Visits (Telemedicine).   Patients are able to view lab/test results, encounter notes, upcoming appointments, etc.  Non-urgent messages can be sent to your provider as well.   To learn more about what you can do with MyChart, go to  [email or web address].     Signed, Redell Cave, MD  12/26/2023 10:13 AM    [facility]

## 2024-01-01 ENCOUNTER — Telehealth: Payer: Self-pay | Admitting: Cardiology

## 2024-01-01 NOTE — Telephone Encounter (Signed)
 The patient called to report that she has developed a rash and accidentally removed her heart monitor while scratching in her sleep. Nurse instructed pt to contact the monitoring company to inform them of the situation. The patient verbalized understanding.

## 2024-01-01 NOTE — Telephone Encounter (Signed)
 Pt would like a c/b regarding monitor. Pt states that she has developed a rash as well as pulled monitor off when scratching. Please advise

## 2024-01-03 ENCOUNTER — Other Ambulatory Visit: Payer: Self-pay | Admitting: General Practice

## 2024-01-03 DIAGNOSIS — F419 Anxiety disorder, unspecified: Secondary | ICD-10-CM

## 2024-01-03 NOTE — Telephone Encounter (Signed)
 Patient was supposed to schedule anxiety f/u for next week. Please call patient to schedule f/u with [place].

## 2024-01-10 ENCOUNTER — Encounter: Payer: Self-pay | Admitting: General Practice

## 2024-01-10 ENCOUNTER — Ambulatory Visit (INDEPENDENT_AMBULATORY_CARE_PROVIDER_SITE_OTHER): Admitting: General Practice

## 2024-01-10 ENCOUNTER — Ambulatory Visit: Payer: Self-pay | Admitting: General Practice

## 2024-01-10 VITALS — BP 132/70 | HR 75 | Temp 98.5°F | Ht 61.0 in | Wt 163.1 lb

## 2024-01-10 DIAGNOSIS — R0600 Dyspnea, unspecified: Secondary | ICD-10-CM

## 2024-01-10 DIAGNOSIS — J069 Acute upper respiratory infection, unspecified: Secondary | ICD-10-CM

## 2024-01-10 DIAGNOSIS — R7303 Prediabetes: Secondary | ICD-10-CM

## 2024-01-10 DIAGNOSIS — R002 Palpitations: Secondary | ICD-10-CM

## 2024-01-10 DIAGNOSIS — G932 Benign intracranial hypertension: Secondary | ICD-10-CM

## 2024-01-10 DIAGNOSIS — F419 Anxiety disorder, unspecified: Secondary | ICD-10-CM

## 2024-01-10 LAB — POCT INFLUENZA A/B
Influenza A, POC: NEGATIVE
Influenza B, POC: NEGATIVE

## 2024-01-10 LAB — POC COVID19 BINAXNOW: SARS Coronavirus 2 Ag: NEGATIVE

## 2024-01-10 MED ORDER — ESCITALOPRAM OXALATE 10 MG PO TABS: 10.0000 mg | ORAL_TABLET | Freq: Every day | ORAL | 0 refills | Status: AC

## 2024-01-10 NOTE — Patient Instructions (Addendum)
 Start Lexapro  10 mg once daily at bedtime.   You can try a few things over the counter to help with your symptoms including:  Cough: Delsym or Robitussin (get the off brand, works just as well) Chest Congestion: Mucinex (plain) Nasal Congestion/Ear Pressure/Sinus Pressure: Try using Flonase (fluticasone) nasal spray. Instill 1 spray in each nostril twice daily. This can be purchased over the counter. Body aches, fevers, headache: Ibuprofen  (not to exceed 2400 mg in 24 hours) or Acetaminophen -Tylenol  (not to exceed 3000 mg in 24 hours) Runny Nose/Throat Drainage/Sneezing/Itchy or Watery Eyes: An antihistamine such as Zyrtec, Claritin, Xyzal, Allegra  You should be feeling better by day seven of symptoms, but please do schedule an appointment if this is not the case.  Follow up in 6 months chronic care management.   It was a pleasure to see you today!

## 2024-01-10 NOTE — Progress Notes (Signed)
 Established Patient Office Visit  Subjective   Patient ID: Danielle Goodman, female    DOB: Jan 06, 1996  Age: 28 y.o. MRN: [id number]  Chief Complaint  Patient presents with   Medical Management of Chronic Issues    Here for anxiety f/u.   Shortness of Breath    C/o nasal congestion and SOB. Sxs started 01/09/24. Exposed to her children [x2]- Covid pos and have accompanied pt to today's OV.    Shortness of Breath Pertinent negatives include no abdominal pain, chest pain, ear pain, fever, headaches, sore throat, vomiting or wheezing.   Discussed the use of AI scribe software for clinical note transcription with the patient, who gave verbal consent to proceed.  History of Present Illness Danielle Goodman is a 28 year old female with anxiety and idiopathic intracranial hypertension who presents with anxiety management and COVID exposure.  She was previously on Zoloft  but discontinued it due to nightmares. She started Lexapro  at 5 mg, later increased to 10 mg, and has been on it for four weeks. She experiences ongoing anxiety and mistakenly took Lexapro  twice daily instead of once.  Hydroxyzine  was used as needed but increased her heart rate.   She is undergoing evaluation for palpitations and has been referred to a cardiologist. She recently wore a heart monitor but experienced a skin reaction and is awaiting a replacement.   She reports nasal congestion, shortness of breath, and a mucousy cough, which began yesterday. Her child tested positive for COVID yesterday, and she has been feeling unwell for a while, initially attributing it to allergies. No wheezing, ear pain, sinus pain, sore throat.   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     Patient Active Problem List   Diagnosis Date Noted   Dyspnea 01/10/2024   IIH (idiopathic intracranial hypertension) 01/10/2024   Prediabetes 01/10/2024    Encounter for screening and preventative care 12/12/2023   Hospital discharge follow-up 12/12/2023   Elevated BP without diagnosis of hypertension 12/12/2023   Anxiety 10/19/2023   Migraine without status migrainosus, not intractable 10/19/2023   Alcohol intoxication (HCC) 06/07/2021   Past Medical History:  Diagnosis Date   ADHD (attention deficit hyperactivity disorder)    Asthma    Headache    Migrainse with pregnancty   SVD (spontaneous vaginal delivery) 03/17/2016   Past Surgical History:  Procedure Laterality Date   ADENOIDECTOMY     LAPAROSCOPIC APPENDECTOMY N/A 02/12/2021   Procedure: APPENDECTOMY LAPAROSCOPIC;  Surgeon: Desiderio Schanz, MD;  Location: [facility] ORS;  Service: General;  Laterality: N/A;   ORIF ANKLE FRACTURE Right 06/18/2021   Procedure: OPEN REDUCTION INTERNAL FIXATION (ORIF) ANKLE FRACTURE;  Surgeon: Kendal Franky SQUIBB, MD;  Location: [facility] OR;  Service: Orthopedics;  Laterality: Right;   ORIF FEMUR FRACTURE Left 06/18/2021   Procedure: OPEN REDUCTION INTERNAL FIXATION (ORIF) DISTAL FEMUR FRACTURE;  Surgeon: Kendal Franky SQUIBB, MD;  Location: [facility] OR;  Service: Orthopedics;  Laterality: Left;   TONSILLECTOMY     No Known Allergies       01/10/2024    3:40 PM 12/12/2023   11:01 AM 10/19/2023   10:57 AM  Depression screen PHQ 2/9  Decreased Interest 2 1 0  Down, Depressed, Hopeless 2 2 0  PHQ - 2 Score 4 3 0  Altered sleeping 2 3   Tired, decreased energy 2 3   Change in appetite 2 3   Feeling bad or failure about  yourself  2 3   Trouble concentrating 2 3   Moving slowly or fidgety/restless 2 3   Suicidal thoughts 0 0   PHQ-9 Score 16 21   Difficult doing work/chores Very difficult Very difficult        01/10/2024    3:41 PM 12/12/2023   11:01 AM 10/19/2023   10:57 AM  GAD 7 : Generalized Anxiety Score  Nervous, Anxious, on Edge 2 3 3   Control/stop worrying 3 3 3   Worry too much - different things 3 3 3   Trouble relaxing 3 3 3   Restless 3 3 1   Easily annoyed or  irritable 3 3 0  Afraid - awful might happen  3 0  Total GAD 7 Score  21 13  Anxiety Difficulty Somewhat difficult Very difficult Very difficult      Review of Systems  Constitutional:  Negative for chills and fever.  HENT:  Positive for congestion. Negative for ear pain, sinus pain and sore throat.   Respiratory:  Positive for cough and shortness of breath. Negative for wheezing.   Cardiovascular:  Negative for chest pain.  Gastrointestinal:  Negative for abdominal pain, constipation, diarrhea, heartburn, nausea and vomiting.  Genitourinary:  Negative for dysuria, frequency and urgency.  Neurological:  Negative for dizziness and headaches.  Endo/Heme/Allergies:  Negative for polydipsia.  Psychiatric/Behavioral:  Negative for depression and suicidal ideas. The patient is nervous/anxious.       Objective:     BP 132/70   Pulse 75   Temp 98.5 F (36.9 C) (Oral)   Ht 5' 1 (1.549 m)   Wt 163 lb 2 oz (74 kg)   SpO2 98%   BMI 30.82 kg/m  BP Readings from Last 3 Encounters:  01/10/24 132/70  12/26/23 (!) 100/50  12/12/23 124/82   Wt Readings from Last 3 Encounters:  01/10/24 163 lb 2 oz (74 kg)  12/26/23 166 lb 3.2 oz (75.4 kg)  12/12/23 166 lb (75.3 kg)      Physical Exam Vitals and nursing note reviewed.  Constitutional:      Appearance: Normal appearance.  Cardiovascular:     Rate and Rhythm: Normal rate and regular rhythm.     Pulses: Normal pulses.     Heart sounds: Normal heart sounds.  Pulmonary:     Effort: Pulmonary effort is normal.     Breath sounds: Normal breath sounds. No wheezing.  Neurological:     Mental Status: She is alert and oriented to person, place, and time.  Psychiatric:        Mood and Affect: Mood normal.        Behavior: Behavior normal.        Thought Content: Thought content normal.        Judgment: Judgment normal.      Results for orders placed or performed in visit on 01/10/24  POC COVID-19 BinaxNow  Result Value Ref Range    SARS Coronavirus 2 Ag Negative Negative  POCT Influenza A/B  Result Value Ref Range   Influenza A, POC Negative Negative   Influenza B, POC Negative Negative       The ASCVD Risk score (Arnett DK, et al., 2019) failed to calculate for the following reasons:   The 2019 ASCVD risk score is only valid for ages 81 to 29    Assessment & Plan:  Anxiety -     Escitalopram  Oxalate; Take 1 tablet (10 mg total) by mouth daily.  Dispense: 30 tablet; Refill: 0  Dyspnea, unspecified type -     POC COVID-19 BinaxNow -     POCT Influenza A/B  Prediabetes  IIH (idiopathic intracranial hypertension)  Palpitations  Viral URI    Assessment and Plan Assessment & Plan Anxiety disorder under active pharmacologic management Anxiety managed with Lexapro .  - Continue Lexapro  10 mg once daily at bedtime. - Provide guidance on sleep hygiene, including avoiding caffeine  and screens before bedtime.  Palpitations under cardiac evaluation Awaiting new heart monitor due to previous reaction. - Complete heart monitor evaluation once new device is received.  Idiopathic intracranial hypertension - Following with neurology. Reviewed notes from June. - Continue Diamox 250 mg daily. - Monitor for symptoms of hyponatremia.  - reviewed labs from August.  Acute upper respiratory infection with cough and nasal congestion - POC covid and flu test are negative today. - Provide over-the-counter medication recommendations for symptom relief. - Instruct to report if symptoms persist beyond seven days.  Prediabetes Prediabetes identified on lab work. -discussed the importance of monitoring diet and exercise.   Return in about 6 months (around 07/09/2024) for chronic care management.SABRA Carrol Aurora, NP

## 2024-01-11 NOTE — Telephone Encounter (Signed)
 Email sent to [employer] rep for further instruction on patches. She is out of the office. Calling customer service line for [employer].

## 2024-01-11 NOTE — Telephone Encounter (Signed)
 Called and spoke with the patient.  Patient states that the monitor company told her to reach out to us  to place an order in for her to receive another monitor since she accidentally removed the one she was wearing. Per Dr. Darliss at the last office visit, the patient was ordered 7-day Zio monitor.  Another order for the same placed and is pending.

## 2024-01-11 NOTE — Telephone Encounter (Signed)
 Pt states she was told by the Heart monitor company to request some sensitive skin patches from our office.

## 2024-01-11 NOTE — Addendum Note (Signed)
 Addended by: Kino Dunsworth A on: 01/11/2024 09:33 AM   Modules accepted: Orders

## 2024-01-11 NOTE — Telephone Encounter (Signed)
 Spoke with customer service and they apologized if someone told the patient to contact our office for additional patches. She states they do not have any other patches that cater to sensitive skin. Will reach out to patient to get additional information.

## 2024-01-11 NOTE — Telephone Encounter (Signed)
 Spoke with patient and she reports that someone already called her in regards to this matter. No other needs for now.

## 2024-01-18 ENCOUNTER — Ambulatory Visit (INDEPENDENT_AMBULATORY_CARE_PROVIDER_SITE_OTHER): Admitting: Clinical

## 2024-01-18 DIAGNOSIS — F330 Major depressive disorder, recurrent, mild: Secondary | ICD-10-CM

## 2024-01-18 DIAGNOSIS — F411 Generalized anxiety disorder: Secondary | ICD-10-CM | POA: Diagnosis not present

## 2024-01-18 DIAGNOSIS — F1011 Alcohol abuse, in remission: Secondary | ICD-10-CM | POA: Diagnosis not present

## 2024-01-18 NOTE — Progress Notes (Unsigned)
   Darice Seats, LCSW
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
## 2024-01-19 ENCOUNTER — Other Ambulatory Visit: Payer: Self-pay | Admitting: General Practice

## 2024-01-19 DIAGNOSIS — R002 Palpitations: Secondary | ICD-10-CM

## 2024-01-19 DIAGNOSIS — F419 Anxiety disorder, unspecified: Secondary | ICD-10-CM

## 2024-01-25 ENCOUNTER — Ambulatory Visit: Attending: Cardiology

## 2024-01-25 DIAGNOSIS — F419 Anxiety disorder, unspecified: Secondary | ICD-10-CM

## 2024-01-25 DIAGNOSIS — R002 Palpitations: Secondary | ICD-10-CM

## 2024-01-25 NOTE — Addendum Note (Signed)
 Addended by: BRIEN SALM on: 01/25/2024 08:27 AM   Modules accepted: Orders

## 2024-01-25 NOTE — Telephone Encounter (Signed)
 Pt is calling checking on monitor order because she hasn't received it. Please advise

## 2024-01-25 NOTE — Telephone Encounter (Signed)
 Called patient regarding zio monitor. Noticed that the last order was pended. I replaced the order and told the patient it should be there within the next week. Patient verbalized understanding of information. No further questions at this time.

## 2024-01-30 ENCOUNTER — Other Ambulatory Visit (HOSPITAL_BASED_OUTPATIENT_CLINIC_OR_DEPARTMENT_OTHER): Payer: Self-pay

## 2024-02-05 ENCOUNTER — Ambulatory Visit (INDEPENDENT_AMBULATORY_CARE_PROVIDER_SITE_OTHER): Admitting: Clinical

## 2024-02-05 DIAGNOSIS — F1011 Alcohol abuse, in remission: Secondary | ICD-10-CM

## 2024-02-05 DIAGNOSIS — F411 Generalized anxiety disorder: Secondary | ICD-10-CM

## 2024-02-05 DIAGNOSIS — F330 Major depressive disorder, recurrent, mild: Secondary | ICD-10-CM

## 2024-02-05 NOTE — Progress Notes (Signed)
 [facility] Counselor/Therapist Progress Note  Patient ID: Danielle Goodman, MRN: [id number]    Date: 02/05/24  Time Spent: 2:34  pm - 3:14 pm : 40 Minutes  Treatment Type: Individual Therapy.  Reported Symptoms: anxiety, worry, depressed mood at times  Mental Status Exam: Appearance:  Neat and Well Groomed     Behavior: Appropriate  Motor: Normal  Speech/Language:  Clear and Coherent and Normal Rate  Affect: Appropriate  Mood: normal  Thought process: normal  Thought content:   WNL  Sensory/Perceptual disturbances:   WNL  Orientation: oriented to person, place, time/date, and situation  Attention: Fair distracted by environment  Concentration: Fair distracted by environment  Memory: WNL  Fund of knowledge:  Good  Insight:   Good  Judgment:  Good  Impulse Control: Good   Risk Assessment: Danger to Self:  No Patient denied current suicidal ideation  Self-injurious Behavior: No Danger to Others: No Patient denied current homicidal ideation Duty to Warn:no Physical Aggression / Violence:No  Access to Firearms a concern: No  Gang Involvement:No   Subjective:  Patient stated, I don't think anything has changed in response to events since last session. Patient stated, roughly about the same, some days are better than others in response to mood since last session. Patient reported financial stressors. Patient stated, I feel pretty good today. Patient stated, I agree to that,  Danielle Goodman known that I had generalized anxiety. Patient stated, I can definitely see that in response to diagnosis of Major Depressive Disorder. Patient stated, Its not constant in reference to depressed mood. Patient stated, I have relapsed a couple times in reference to alcohol use. Patient reported using alcohol in response to anxiety. Patient stated, I feel like if I could get my anxiety under control I wouldn't drink at all. Patient stated, I agree to that in response to  consultation with psychiatrist. Patient reported patient has attended one AA meeting in the past and stated, I definitely need to in reference to AA attendance. Patient stated, I definitely want to do therapy, I need to do therapy. Patient stated, improve anxiety, that's the only thing I can think of right now in reference to goals. Patient reported worry related to patient's children, stated I worry about just life, money and reported what if worries.   Interventions: Motivational Interviewing. Clinician conducted session via caregility video from clinician's home office. Patient provided verbal consent to proceed with telehealth session and is aware of limitations of telephone or video visits. Patient participated in session from patient's vehicle at patient's mother's home. Reviewed events since last session and assessed for changes. Clinician reviewed diagnoses and treatment recommendations. Provided psycho education related to diagnoses and treatment. Explored patient's support system as it relates to substance use and discussed participation in [employer] Clinician utilized motivational interviewing to explore potential goals for therapy.  Collaboration of Care: Other not required at this time  Diagnosis:  Generalized anxiety disorder  Mild episode of recurrent major depressive disorder  Alcohol use disorder, mild, in early remission   Plan: Goals/target dates to be developed during follow up appointment on 02/26/24 due to patient experiencing difficulty identifying goals.                  Danielle Seats, LCSW

## 2024-02-06 DIAGNOSIS — H52223 Regular astigmatism, bilateral: Secondary | ICD-10-CM | POA: Diagnosis not present

## 2024-02-07 ENCOUNTER — Ambulatory Visit: Admitting: Cardiology

## 2024-02-17 ENCOUNTER — Emergency Department
Admission: EM | Admit: 2024-02-17 | Discharge: 2024-02-17 | Discharge status: Against Medical Advice | Attending: Emergency Medicine | Admitting: Emergency Medicine

## 2024-02-17 ENCOUNTER — Other Ambulatory Visit: Payer: Self-pay

## 2024-02-17 DIAGNOSIS — R519 Headache, unspecified: Secondary | ICD-10-CM | POA: Diagnosis not present

## 2024-02-17 DIAGNOSIS — Z5321 Procedure and treatment not carried out due to patient leaving prior to being seen by health care provider: Secondary | ICD-10-CM | POA: Insufficient documentation

## 2024-02-17 MED ORDER — ACETAMINOPHEN 325 MG PO TABS
650.0000 mg | ORAL_TABLET | Freq: Once | ORAL | Status: AC
  Administered 2024-02-17: 650 mg via ORAL
  Filled 2024-02-17: qty 2

## 2024-02-17 NOTE — ED Notes (Signed)
 Pt came to front desk and reported she is leaving as she has to be at work at 7 am. RN encouraged pt to stay but pt repoted she has to go due to work. RN encouraged pt to return to ER if any she continues to have symptoms

## 2024-02-17 NOTE — ED Triage Notes (Signed)
 Patient C/O headache that began tonight. Patient states that she was heating up some milk and had a sudden pain on the top of her head. Patient does have a history of migraines but states that this doesn't feel the same. Denies blurred vision, dizziness, nausea, numbness, tingling, or other neurological symptoms at this time.

## 2024-02-26 ENCOUNTER — Encounter: Admitting: Clinical

## 2024-02-26 NOTE — Progress Notes (Signed)
                Day Deery, LCSWThis encounter was created in error - please disregard. 

## 2024-02-27 ENCOUNTER — Ambulatory Visit: Attending: Cardiology | Admitting: Cardiology

## 2024-03-01 ENCOUNTER — Encounter: Payer: Self-pay | Admitting: Cardiology

## 2024-04-01 ENCOUNTER — Encounter: Admitting: Clinical

## 2024-04-01 NOTE — Progress Notes (Signed)
 This encounter was created in error - please disregard.

## 2024-04-01 NOTE — Progress Notes (Signed)
   Darice Seats, LCSW

## 2024-07-09 ENCOUNTER — Ambulatory Visit: Admitting: General Practice
